# Patient Record
Sex: Female | Born: 1970 | Hispanic: No | Marital: Married | Smoking: Never smoker
Health system: Southern US, Community
[De-identification: ages and names within clinical notes are randomized; demographics above are authoritative.]

## PROBLEM LIST (undated history)

## (undated) ENCOUNTER — Emergency Department (HOSPITAL_BASED_OUTPATIENT_CLINIC_OR_DEPARTMENT_OTHER): Admission: EM | Payer: 59

## (undated) DIAGNOSIS — E119 Type 2 diabetes mellitus without complications: Secondary | ICD-10-CM

## (undated) DIAGNOSIS — D649 Anemia, unspecified: Secondary | ICD-10-CM

## (undated) DIAGNOSIS — E7800 Pure hypercholesterolemia, unspecified: Secondary | ICD-10-CM

## (undated) DIAGNOSIS — E559 Vitamin D deficiency, unspecified: Secondary | ICD-10-CM

## (undated) DIAGNOSIS — E049 Nontoxic goiter, unspecified: Secondary | ICD-10-CM

## (undated) DIAGNOSIS — E041 Nontoxic single thyroid nodule: Secondary | ICD-10-CM

## (undated) DIAGNOSIS — E785 Hyperlipidemia, unspecified: Secondary | ICD-10-CM

## (undated) DIAGNOSIS — Z862 Personal history of diseases of the blood and blood-forming organs and certain disorders involving the immune mechanism: Secondary | ICD-10-CM

## (undated) HISTORY — DX: Hyperlipidemia, unspecified: E78.5

## (undated) HISTORY — PX: DILATION AND CURETTAGE OF UTERUS: SHX78

## (undated) HISTORY — PX: OTHER SURGICAL HISTORY: SHX169

## (undated) HISTORY — DX: Anemia, unspecified: D64.9

## (undated) HISTORY — DX: Type 2 diabetes mellitus without complications: E11.9

---

## 1999-10-29 ENCOUNTER — Ambulatory Visit (HOSPITAL_COMMUNITY): Admission: RE | Admit: 1999-10-29 | Discharge: 1999-10-29 | Payer: Self-pay | Admitting: Obstetrics

## 1999-10-29 ENCOUNTER — Inpatient Hospital Stay (HOSPITAL_COMMUNITY): Admission: RE | Admit: 1999-10-29 | Discharge: 1999-10-29 | Payer: Self-pay | Admitting: *Deleted

## 1999-11-04 ENCOUNTER — Ambulatory Visit (HOSPITAL_COMMUNITY): Admission: AD | Admit: 1999-11-04 | Discharge: 1999-11-04 | Payer: Self-pay | Admitting: *Deleted

## 1999-11-21 ENCOUNTER — Encounter: Admission: RE | Admit: 1999-11-21 | Discharge: 1999-11-21 | Payer: Self-pay | Admitting: Obstetrics

## 2000-06-29 ENCOUNTER — Ambulatory Visit (HOSPITAL_COMMUNITY): Admission: RE | Admit: 2000-06-29 | Discharge: 2000-06-29 | Payer: Self-pay | Admitting: *Deleted

## 2000-08-13 ENCOUNTER — Ambulatory Visit (HOSPITAL_COMMUNITY): Admission: RE | Admit: 2000-08-13 | Discharge: 2000-08-13 | Payer: Self-pay | Admitting: *Deleted

## 2001-01-15 ENCOUNTER — Encounter (HOSPITAL_COMMUNITY): Admission: RE | Admit: 2001-01-15 | Discharge: 2001-01-26 | Payer: Self-pay | Admitting: *Deleted

## 2001-01-16 ENCOUNTER — Inpatient Hospital Stay (HOSPITAL_COMMUNITY): Admission: AD | Admit: 2001-01-16 | Discharge: 2001-01-16 | Payer: Self-pay | Admitting: *Deleted

## 2001-01-22 ENCOUNTER — Observation Stay (HOSPITAL_COMMUNITY): Admission: AD | Admit: 2001-01-22 | Discharge: 2001-01-22 | Payer: Self-pay | Admitting: Obstetrics

## 2001-01-25 ENCOUNTER — Inpatient Hospital Stay (HOSPITAL_COMMUNITY): Admission: AD | Admit: 2001-01-25 | Discharge: 2001-01-28 | Payer: Self-pay | Admitting: Obstetrics

## 2001-02-01 ENCOUNTER — Inpatient Hospital Stay (HOSPITAL_COMMUNITY): Admission: AD | Admit: 2001-02-01 | Discharge: 2001-02-01 | Payer: Self-pay | Admitting: Obstetrics & Gynecology

## 2004-08-10 ENCOUNTER — Emergency Department (HOSPITAL_COMMUNITY): Admission: EM | Admit: 2004-08-10 | Discharge: 2004-08-10 | Payer: Self-pay

## 2005-05-01 ENCOUNTER — Inpatient Hospital Stay (HOSPITAL_COMMUNITY): Admission: AD | Admit: 2005-05-01 | Discharge: 2005-05-01 | Payer: Self-pay | Admitting: Obstetrics

## 2005-08-01 ENCOUNTER — Ambulatory Visit (HOSPITAL_COMMUNITY): Admission: RE | Admit: 2005-08-01 | Discharge: 2005-08-01 | Payer: Self-pay | Admitting: Obstetrics

## 2005-09-17 ENCOUNTER — Inpatient Hospital Stay (HOSPITAL_COMMUNITY): Admission: RE | Admit: 2005-09-17 | Discharge: 2005-09-20 | Payer: Self-pay | Admitting: Obstetrics

## 2005-09-26 ENCOUNTER — Inpatient Hospital Stay (HOSPITAL_COMMUNITY): Admission: AD | Admit: 2005-09-26 | Discharge: 2005-09-26 | Payer: Self-pay | Admitting: Obstetrics

## 2005-10-03 ENCOUNTER — Ambulatory Visit (HOSPITAL_COMMUNITY): Admission: RE | Admit: 2005-10-03 | Discharge: 2005-10-03 | Payer: Self-pay | Admitting: Obstetrics & Gynecology

## 2006-07-03 ENCOUNTER — Encounter: Admission: RE | Admit: 2006-07-03 | Discharge: 2006-07-03 | Payer: Self-pay | Admitting: Otolaryngology

## 2010-09-08 ENCOUNTER — Encounter: Payer: Self-pay | Admitting: Obstetrics & Gynecology

## 2010-10-18 ENCOUNTER — Encounter (INDEPENDENT_AMBULATORY_CARE_PROVIDER_SITE_OTHER): Payer: Self-pay | Admitting: *Deleted

## 2010-10-18 LAB — CONVERTED CEMR LAB
AST: 16 units/L (ref 0–37)
Albumin: 4.3 g/dL (ref 3.5–5.2)
CO2: 27 meq/L (ref 19–32)
Calcium: 9.9 mg/dL (ref 8.4–10.5)
Ferritin: 81 ng/mL (ref 10–291)
Free Thyroxine Index: 2.7 (ref 1.0–3.9)
HCT: 35.9 % — ABNORMAL LOW (ref 36.0–46.0)
Hemoglobin: 11.7 g/dL — ABNORMAL LOW (ref 12.0–15.0)
Hgb A1c MFr Bld: 6 % — ABNORMAL HIGH (ref <5.7)
MCHC: 32.6 g/dL (ref 30.0–36.0)
MCV: 86.1 fL (ref 78.0–100.0)
Platelets: 371 10*3/uL (ref 150–400)
Potassium: 5.1 meq/L (ref 3.5–5.3)
RBC: 4.17 M/uL (ref 3.87–5.11)
Total Bilirubin: 0.2 mg/dL — ABNORMAL LOW (ref 0.3–1.2)
WBC: 8.7 10*3/uL (ref 4.0–10.5)

## 2010-11-07 ENCOUNTER — Other Ambulatory Visit (HOSPITAL_COMMUNITY): Payer: Self-pay | Admitting: Family Medicine

## 2010-11-07 DIAGNOSIS — E049 Nontoxic goiter, unspecified: Secondary | ICD-10-CM

## 2010-11-28 ENCOUNTER — Ambulatory Visit (HOSPITAL_COMMUNITY)
Admission: RE | Admit: 2010-11-28 | Discharge: 2010-11-28 | Disposition: A | Discharge status: Home / Self Care | Payer: Self-pay | Source: Ambulatory Visit | Attending: Family Medicine | Admitting: Family Medicine

## 2010-11-28 DIAGNOSIS — E042 Nontoxic multinodular goiter: Secondary | ICD-10-CM | POA: Insufficient documentation

## 2010-11-28 DIAGNOSIS — E049 Nontoxic goiter, unspecified: Secondary | ICD-10-CM

## 2011-01-06 ENCOUNTER — Other Ambulatory Visit (HOSPITAL_COMMUNITY): Payer: Self-pay | Admitting: Family Medicine

## 2011-01-06 ENCOUNTER — Other Ambulatory Visit: Payer: Self-pay | Admitting: Family Medicine

## 2011-01-06 DIAGNOSIS — Z1231 Encounter for screening mammogram for malignant neoplasm of breast: Secondary | ICD-10-CM

## 2011-01-16 ENCOUNTER — Ambulatory Visit (HOSPITAL_COMMUNITY)
Admission: RE | Admit: 2011-01-16 | Discharge: 2011-01-16 | Disposition: A | Discharge status: Home / Self Care | Payer: Medicaid Other | Source: Ambulatory Visit | Attending: Family Medicine | Admitting: Family Medicine

## 2011-01-16 DIAGNOSIS — Z1231 Encounter for screening mammogram for malignant neoplasm of breast: Secondary | ICD-10-CM | POA: Insufficient documentation

## 2013-05-27 ENCOUNTER — Other Ambulatory Visit (HOSPITAL_COMMUNITY): Payer: Self-pay | Admitting: Nurse Practitioner

## 2013-05-27 DIAGNOSIS — Z1231 Encounter for screening mammogram for malignant neoplasm of breast: Secondary | ICD-10-CM

## 2013-06-09 ENCOUNTER — Ambulatory Visit (HOSPITAL_COMMUNITY)
Admission: RE | Admit: 2013-06-09 | Discharge: 2013-06-09 | Disposition: A | Discharge status: Home / Self Care | Payer: Medicaid Other | Source: Ambulatory Visit | Attending: Nurse Practitioner | Admitting: Nurse Practitioner

## 2013-06-09 DIAGNOSIS — Z1231 Encounter for screening mammogram for malignant neoplasm of breast: Secondary | ICD-10-CM | POA: Insufficient documentation

## 2013-12-07 ENCOUNTER — Other Ambulatory Visit: Payer: Self-pay | Admitting: Family Medicine

## 2013-12-07 DIAGNOSIS — E0590 Thyrotoxicosis, unspecified without thyrotoxic crisis or storm: Secondary | ICD-10-CM

## 2013-12-12 ENCOUNTER — Ambulatory Visit
Admission: RE | Admit: 2013-12-12 | Discharge: 2013-12-12 | Disposition: A | Discharge status: Home / Self Care | Payer: Medicaid Other | Source: Ambulatory Visit | Attending: Family Medicine | Admitting: Family Medicine

## 2013-12-12 ENCOUNTER — Other Ambulatory Visit: Payer: Self-pay | Admitting: Family Medicine

## 2013-12-12 DIAGNOSIS — M79641 Pain in right hand: Secondary | ICD-10-CM

## 2013-12-12 DIAGNOSIS — E0590 Thyrotoxicosis, unspecified without thyrotoxic crisis or storm: Secondary | ICD-10-CM

## 2013-12-13 ENCOUNTER — Other Ambulatory Visit: Payer: Medicaid Other

## 2013-12-23 ENCOUNTER — Ambulatory Visit: Payer: Medicaid Other | Admitting: Internal Medicine

## 2013-12-29 ENCOUNTER — Ambulatory Visit (INDEPENDENT_AMBULATORY_CARE_PROVIDER_SITE_OTHER): Payer: Medicaid Other | Admitting: Internal Medicine

## 2013-12-29 ENCOUNTER — Encounter: Payer: Self-pay | Admitting: Internal Medicine

## 2013-12-29 VITALS — BP 112/78 | HR 99 | Temp 98.4°F | Resp 12 | Ht 61.0 in | Wt 210.0 lb

## 2013-12-29 DIAGNOSIS — R7989 Other specified abnormal findings of blood chemistry: Secondary | ICD-10-CM

## 2013-12-29 DIAGNOSIS — E042 Nontoxic multinodular goiter: Secondary | ICD-10-CM

## 2013-12-29 DIAGNOSIS — R946 Abnormal results of thyroid function studies: Secondary | ICD-10-CM

## 2013-12-29 DIAGNOSIS — E0590 Thyrotoxicosis, unspecified without thyrotoxic crisis or storm: Secondary | ICD-10-CM

## 2013-12-29 LAB — T4, FREE: FREE T4: 0.79 ng/dL (ref 0.60–1.60)

## 2013-12-29 LAB — TSH: TSH: 0.19 u[IU]/mL — ABNORMAL LOW (ref 0.35–4.50)

## 2013-12-29 LAB — T3, FREE: T3 FREE: 3 pg/mL (ref 2.3–4.2)

## 2013-12-29 NOTE — Patient Instructions (Addendum)
Please return in 6 months. Please stop at the lab. Please join MYchart >> I will send you labs through there. We may need an Uptake and Scan as we discussed, I will let you know.

## 2013-12-29 NOTE — Progress Notes (Addendum)
Patient ID: Sarah GranaSarah A Knueppel, female   DOB: [birth year], 43 y.o.   MRN: [id number]   HPI  Sarah Travis is a 43 y.o.-year-old female originally from [place], referred by her PCP, Dr. Daphine DeutscherMartin, in consultation for thyrotoxycosis.   She was dx with MNG in 2007 and saw Dr. Leslie DalesAltheimer for 3 years (2007-2010) and had yearly U/Ss and TFTs every 6 months >> all stable.   I reviewed pt's thyroid tests: 10/28/2013: TSH 0.173 - per records from PCP Lab Results  Component Value Date   TSH 0.447 10/18/2010    She also had a thyroid U/S on 12/12/2013, showing MNG.  Pt denies feeling nodules in neck, hoarseness, dysphagia/odynophagia, SOB with lying down; but has occasional cough since 2007.  She:  - no weight loss; stable weight - no excessive sweating/heat intolerance; hands and feet are cold - no tremors - no anxiety - + palpitations - occasionally - no problems with concentration - no fatigue - no hyperdefecation  Pt does have a FH of thyroid ds in aunt. No FH of thyroid cancer. No h/o radiation tx to head or neck.  No seaweed or kelp, no recent contrast studies. No steroid use. No herbal supplements.   I reviewed her chart and she also has a history of HL.  ROS: Constitutional: see HPI, + nocturia Eyes: no blurry vision, no xerophthalmia ENT: no sore throat, no nodules palpated in throat, no dysphagia/odynophagia, no hoarseness, + tinnitus Cardiovascular: no CP/SOB/palpitations/leg swelling Respiratory: + cough/no SOB Gastrointestinal: no N/V/D/C Musculoskeletal: no muscle/joint aches Skin: no rashes, + hair loss Neurological: no tremors/numbness/tingling/dizziness Psychiatric: no depression/anxiety  PMH: - see HPI  History   Social History  . Marital Status: Married    Spouse Name: N/A    Number of Children: 3: 1516,12,8 y/o        Occupational History  . Stay at home mom   Social History Main Topics  . Smoking status: Never Smoker   . Smokeless tobacco: No  .  Alcohol Use: No  . Drug Use: No   Current Outpatient Rx  Name  Route  Sig  Dispense  Refill  . Norethindrone-Ethinyl Estradiol Triphasic (TRI-NORINYL, 28,) 0.5/1/0.5-35 MG-MCG tablet   Oral   Take 1 tablet by mouth daily.         . pravastatin (PRAVACHOL) 20 MG tablet   Oral   Take 20 mg by mouth daily.          FH: - diabetes in mother and sister - Hypertension in mother and sister - Hyperlipidemia in father - Heart problems in mother and father - No cancer family history  NKDA  PE: BP 112/78  Pulse 99  Temp(Src) 98.4 F (36.9 C) (Oral)  Resp 12  Ht 5\' 1"  (1.549 m)  Wt 210 lb (95.255 kg)  BMI 39.70 kg/m2  SpO2 97% Wt Readings from Last 3 Encounters:  12/29/13 210 lb (95.255 kg)   Constitutional: overweight, in NAD Eyes: PERRLA, EOMI, no exophthalmos, no lid lag, no stare ENT: moist mucous membranes, + thyromegaly, L>R, no individual nodules palpated; no thyroid bruits, no cervical lymphadenopathy Cardiovascular: RRR, No MRG Respiratory: CTA B Gastrointestinal: abdomen soft, NT, ND, BS+ Musculoskeletal: no deformities, strength intact in all 4 Skin: moist, warm, no rashes Neurological: no tremor with outstretched hands, DTR normal in all 4  ASSESSMENT: 1. Thyrotoxycosis  2. MNG - repeated U/S's in the past, last 12/12/2013:  Right thyroid lobe : 7.7 x 2.9 x 2.8 cm. Thyroid gland is  enlarged and heterogeneous. There is a heterogeneous nodule in the mid right thyroid lobe that measures 2.0 x 1.7 x 2.2 cm. No definite calcifications and the nodule is not significantly vascular. Nodule previously measured 1.7 x 1.5 x 1.4 cm. Complex nodule in the inferior right thyroid lobe measures 2.8 x 2.4 x 2.5 cm. Nodule previously measured 2.3 x 2.3 x 2.3 cm. This nodule has cystic areas within it. No definite calcifications. Third nodule in the mid right thyroid lobe measures 1.5 x 0.9 x 1.6 cm and measured up to 1.5 cm on 07/03/2006.   Left thyroid lobe: 8.7 x 3.7 x 4.2  cm. Left thyroid lobe is enlarged and diffusely heterogeneous. Nodule in the superior left thyroid lobe measures 2.1 x 1.6 x 2.2 cm and heterogeneous. Nodule previously measured 1.9 x 1.8 x 2.5 cm. Nodule in the mid left thyroid lobe measures 3.8 x 1.9 x 3.9 cm. This nodule is diffusely heterogeneous and previously measured 3.8 x 1.9 x 4.1 cm. Nodule in the posterior left thyroid lobe measures 1.6 x 1.3 x 1.4 cm and previously measured 1.5 x 1.4 x 1.6 cm. Dominant nodule in the inferior left thyroid lobe measures 3.7 x 2.8 x 3.4 cm and previously measured 3.0 x 2.9 x 2.9 cm   Isthmus : 0.4 cm. Small nodule along the left side of the isthmus measures up to 0.6 cm and previously measured up to 0.9 cm.   Lymphadenopathy None visualized.   PLAN:  1. And 2. Patient with a recently found low TSH, without thyrotoxic sxs: no weight loss, heat intolerance, hyperdefecation, anxiety. Has occasional palpitations, now new. - she does not appear to have exogenous causes for the low TSH.  - We discussed that possible causes of thyrotoxicosis are:  Graves ds   Thyroiditis toxic multinodular goiter/ toxic adenoma (she has multiple nodules). - I suggested that we check the TSH, fT3 and fT4 and also had thyroid stimulating antibodies to screen for Graves' disease.  - If the tests remain abnormal (which I suspect) I will obtain an uptake and scan to differentiate between the 3 above possible etiologies, but more to see if the nodules are "hot"  - in this case, very low risk for cancer. - I reviewed the thyroid ultrasound images along with the patient, and I feel that the thyroid nodules are not very well defined, and they mainly constitute increased heterogeneity within the gland. I would not recommend biopsy at this point. We may need to repeat thyroid ultrasound in the future, in hopes that some of the nodule we'll organize a little bit more, but probably in 2 years from now.  - I advised her to join my chart to  communicate easier - RTC in 6 months, but possibly sooner for repeat labs  Office Visit on 12/29/2013  Component Date Value Ref Range Status  . TSH 12/29/2013 0.19* 0.35 - 4.50 uIU/mL Final  . Free T4 12/29/2013 0.79  0.60 - 1.60 ng/dL Final  . TSI 96/11/5407 76  <140 % baseline Final  . T3, Free 12/29/2013 3.0  2.3 - 4.2 pg/mL Final   I ordered the Uptake and scan. I will addend the results when they become available.  CLINICAL DATA: Low TSH = 0.19, multinodular goiter  EXAM: THYROID SCAN AND UPTAKE - 24 HOURS  TECHNIQUE: Following the per oral administration of I-131 sodium iodide, the patient returned at 24 hours and uptake measurements were acquired with the uptake probe centered on the neck. Thyroid imaging  was performed following the intravenous administration of the Tc-7065m Pertechnetate.  RADIOPHARMACEUTICALS: 15 microCuries I-131 Sodium Iodide orally and 10.9 mCi Tc-6765m Pertechnetate IV  COMPARISON: Thyroid ultrasound 12/12/2013, 11/28/2010  FINDINGS: 24 hour radioiodine uptake calculated at 12%, normal.  Images of the thyroid gland demonstrate a multinodular appearance of both lobes with multiple warm and cold nodules.  No dominant hot nodules.  Dominant cold nodule at mid to upper pole of LEFT lobe corresponding to the largest nodule on ultrasound, though this nodule is stable in size from an earlier ultrasound from 2012.  IMPRESSION: Multinodular thyroid gland as above.  Normal 24 hr radio iodine uptake of 12%.   Electronically Signed By: Ulyses SouthwardMark Boles M.D. On: 02/16/2014 13:43  I would suggest to Bx the L largest nodules. None of the nodules are hypervascular, contain calcifications or present worrisome features other than size.  Msg sent: Dear Ms Carolee RotaZeinelabdeen, The uptake and scan shows that the thyroid uptake is normal, meaning that, overall, the thyroid is not functioning in overdrive, which is great! There are some "cold spots" in the thyroid,  meaning regions that are mostly inactive. I believe we need to biopsy the 2 largest of these regions. This is done [facility] from [facility], with local anesthesia. Please let me know what you think.  Sincerely, Carlus Pavlovristina Camika Marsico MD   Adequacy Reason Satisfactory For Evaluation. Diagnosis THYROID, FINE NEEDLE ASPIRATION, LEFT INFERIOR (SPECIMEN 1 OF 2, COLLECTED ON 03/08/14): BENIGN. FINDINGS CONSISTENT WITH NON-NEOPLASTIC GOITER. Zandra AbtsOBERT HILLARD MD Pathologist, Electronic Signature (Case signed 03/09/2014) Specimen Clinical Information Hyperthyroidsim and thyroid nodules, Dominant nodule in the inferior left thyroid lobe measures 3.7 x 2.8 x 3.4cm Source Thyroid, Fine Needle Aspiration, Left Inferior, (Specimen 1 of 2, collected on 03/08/2014)  Adequacy Reason Satisfactory For Evaluation. Diagnosis THYROID, FINE NEEDLE ASPIRATION, LEFT MID (SPECIMEN 2 OF 2, COLLECTED ON 03/08/14): BENIGN. FINDINGS CONSISTENT WITH NON-NEOPLASTIC GOITER. Zandra AbtsOBERT HILLARD MD Pathologist, Electronic Signature (Case signed 03/09/2014) Specimen Clinical Information Hyperthyroidism and thyroid nodules, Nodule in mid left thyroid lobe measures 3.8 x 1.9 x 3.9cm, This nodule is diffusely heterogeneous Source Thyroid, Fine Needle Aspiration, Left Mid, (Specimen 2 of 2, collected on 03/08/2014)

## 2014-01-03 ENCOUNTER — Encounter: Payer: Self-pay | Admitting: Internal Medicine

## 2014-01-03 DIAGNOSIS — E0590 Thyrotoxicosis, unspecified without thyrotoxic crisis or storm: Secondary | ICD-10-CM | POA: Insufficient documentation

## 2014-01-03 LAB — THYROID STIMULATING IMMUNOGLOBULIN: TSI: 76 %{baseline} (ref <140)

## 2014-01-10 ENCOUNTER — Telehealth: Payer: Self-pay | Admitting: Internal Medicine

## 2014-01-17 ENCOUNTER — Telehealth: Payer: Self-pay | Admitting: *Deleted

## 2014-01-17 NOTE — Telephone Encounter (Signed)
Returning call about appt trying to figure out whats going on

## 2014-01-17 NOTE — Telephone Encounter (Signed)
Please read note below. Unsure if you called pt or not? Please advise.

## 2014-01-17 NOTE — Telephone Encounter (Signed)
She needs an Uptake and scan scheduled. I did not call her but I ordered this >2 weeks ago so I think she is calling to see what happened.

## 2014-01-20 ENCOUNTER — Encounter: Payer: Self-pay | Admitting: *Deleted

## 2014-02-07 ENCOUNTER — Ambulatory Visit (HOSPITAL_COMMUNITY): Admission: RE | Admit: 2014-02-07 | Payer: Medicaid Other | Source: Ambulatory Visit

## 2014-02-08 ENCOUNTER — Encounter (HOSPITAL_COMMUNITY): Payer: Medicaid Other

## 2014-02-15 ENCOUNTER — Encounter (HOSPITAL_COMMUNITY)
Admission: RE | Admit: 2014-02-15 | Discharge: 2014-02-15 | Disposition: A | Discharge status: Home / Self Care | Payer: Medicaid Other | Source: Ambulatory Visit | Attending: Internal Medicine | Admitting: Internal Medicine

## 2014-02-15 DIAGNOSIS — E042 Nontoxic multinodular goiter: Secondary | ICD-10-CM | POA: Insufficient documentation

## 2014-02-15 DIAGNOSIS — R946 Abnormal results of thyroid function studies: Secondary | ICD-10-CM | POA: Insufficient documentation

## 2014-02-16 ENCOUNTER — Encounter (HOSPITAL_COMMUNITY)
Admission: RE | Admit: 2014-02-16 | Discharge: 2014-02-16 | Disposition: A | Discharge status: Home / Self Care | Payer: Medicaid Other | Source: Ambulatory Visit | Attending: Internal Medicine | Admitting: Internal Medicine

## 2014-02-16 ENCOUNTER — Encounter (HOSPITAL_COMMUNITY): Payer: Self-pay

## 2014-02-16 DIAGNOSIS — R946 Abnormal results of thyroid function studies: Secondary | ICD-10-CM | POA: Insufficient documentation

## 2014-02-16 DIAGNOSIS — E042 Nontoxic multinodular goiter: Secondary | ICD-10-CM | POA: Insufficient documentation

## 2014-02-16 MED ORDER — SODIUM IODIDE I 131 CAPSULE
15.0000 | Freq: Once | INTRAVENOUS | Status: AC | PRN
  Administered 2014-02-16: 15 via ORAL

## 2014-02-16 MED ORDER — SODIUM PERTECHNETATE TC 99M INJECTION
10.9000 | Freq: Once | INTRAVENOUS | Status: AC | PRN
  Administered 2014-02-16: 10.9 via INTRAVENOUS

## 2014-02-24 ENCOUNTER — Telehealth: Payer: Self-pay | Admitting: Internal Medicine

## 2014-02-24 NOTE — Telephone Encounter (Signed)
Pt is interested in biopsy please set up order for this appt thank you

## 2014-02-27 ENCOUNTER — Other Ambulatory Visit: Payer: Self-pay | Admitting: Internal Medicine

## 2014-02-27 DIAGNOSIS — E042 Nontoxic multinodular goiter: Secondary | ICD-10-CM

## 2014-02-27 NOTE — Telephone Encounter (Signed)
I ordered the 2 Bxs.

## 2014-02-27 NOTE — Addendum Note (Signed)
Addended by: Carlus PavlovGHERGHE, Nateisha Moyd on: 02/27/2014 12:53 PM   Modules accepted: Orders

## 2014-02-27 NOTE — Telephone Encounter (Signed)
Please read note below and advise.  

## 2014-03-08 ENCOUNTER — Other Ambulatory Visit (HOSPITAL_COMMUNITY)
Admission: RE | Admit: 2014-03-08 | Discharge: 2014-03-08 | Disposition: A | Discharge status: Home / Self Care | Payer: Medicaid Other | Source: Ambulatory Visit | Attending: Interventional Radiology | Admitting: Interventional Radiology

## 2014-03-08 ENCOUNTER — Ambulatory Visit
Admission: RE | Admit: 2014-03-08 | Discharge: 2014-03-08 | Disposition: A | Discharge status: Home / Self Care | Payer: Medicaid Other | Source: Ambulatory Visit | Attending: Internal Medicine | Admitting: Internal Medicine

## 2014-03-08 DIAGNOSIS — E042 Nontoxic multinodular goiter: Secondary | ICD-10-CM | POA: Insufficient documentation

## 2014-03-08 DIAGNOSIS — E0590 Thyrotoxicosis, unspecified without thyrotoxic crisis or storm: Secondary | ICD-10-CM | POA: Diagnosis not present

## 2014-05-25 ENCOUNTER — Other Ambulatory Visit (HOSPITAL_COMMUNITY): Payer: Self-pay | Admitting: Family Medicine

## 2014-05-25 DIAGNOSIS — Z1231 Encounter for screening mammogram for malignant neoplasm of breast: Secondary | ICD-10-CM

## 2014-06-13 ENCOUNTER — Ambulatory Visit (HOSPITAL_COMMUNITY)
Admission: RE | Admit: 2014-06-13 | Discharge: 2014-06-13 | Disposition: A | Discharge status: Home / Self Care | Payer: Medicaid Other | Source: Ambulatory Visit | Attending: Family Medicine | Admitting: Family Medicine

## 2014-06-13 ENCOUNTER — Other Ambulatory Visit (HOSPITAL_COMMUNITY): Payer: Self-pay | Admitting: Family Medicine

## 2014-06-13 DIAGNOSIS — Z1231 Encounter for screening mammogram for malignant neoplasm of breast: Secondary | ICD-10-CM

## 2014-06-30 ENCOUNTER — Encounter: Payer: Self-pay | Admitting: Internal Medicine

## 2014-06-30 ENCOUNTER — Ambulatory Visit (INDEPENDENT_AMBULATORY_CARE_PROVIDER_SITE_OTHER): Payer: Medicaid Other | Admitting: Internal Medicine

## 2014-06-30 VITALS — BP 114/78 | HR 79 | Temp 98.2°F | Resp 12 | Wt 200.8 lb

## 2014-06-30 DIAGNOSIS — E0520 Thyrotoxicosis with toxic multinodular goiter without thyrotoxic crisis or storm: Secondary | ICD-10-CM | POA: Insufficient documentation

## 2014-06-30 LAB — TSH: TSH: 0.49 u[IU]/mL (ref 0.35–4.50)

## 2014-06-30 LAB — T3, FREE: T3, Free: 3.3 pg/mL (ref 2.3–4.2)

## 2014-06-30 LAB — T4, FREE: Free T4: 0.91 ng/dL (ref 0.60–1.60)

## 2014-06-30 NOTE — Patient Instructions (Signed)
Please stop at the lab.  Please come back for a follow-up appointment in 6 months.  

## 2014-06-30 NOTE — Progress Notes (Signed)
Patient ID: Sarah Travis, female   DOB: [birth year], 43 y.o.   MRN: [id number]   HPI  Sarah GranaSarah A Joswick is a 43 y.o.-year-old female originally from [place], returning for f/u for toxic MNG. Last visit 6 mo ago.  Reviewed hx: She was dx with MNG in 2007 and saw Dr. Leslie DalesAltheimer for 3 years (2007-2010) and had yearly U/Ss and TFTs every 6 months >> all stable.   I reviewed pt's thyroid tests: Lab Results  Component Value Date   TSH 0.19* 12/29/2013   TSH 0.447 10/18/2010   FREET4 0.79 12/29/2013  10/28/2013: TSH 0.173 - per records from PCP  She also had a thyroid U/S on 12/12/2013, showing MNG.  Thyroid Uptake was not increased, at 12%, but the scan was c/w MNG.  Largest 2 nodules were Bx'ed >> benign.  Pt denies feeling nodules in neck, hoarseness, dysphagia/odynophagia, SOB with lying down; but has occasional cough since 2007.  She:  - + weight loss - 10 lbs since last visit - intentional: exercises - no excessive sweating/heat intolerance; hands and feet are cold - no tremors - no anxiety - + palpitations - not frequently - no problems with concentration - no fatigue - no hyperdefecation  I reviewed her chart and she also has a history of HL.  ROS: Constitutional: see HPI, + nocturia Eyes: no blurry vision, no xerophthalmia ENT: no sore throat, no nodules palpated in throat, no dysphagia/odynophagia, no hoarseness, + tinnitus Cardiovascular: no CP/SOB/palpitations/leg swelling Respiratory: + cough/no SOB Gastrointestinal: no N/V/D/C Musculoskeletal: no muscle/joint aches Skin: no rashes, + hair loss Neurological: no tremors/numbness/tingling/dizziness  PE: BP 114/78 mmHg  Pulse 79  Temp(Src) 98.2 F (36.8 C) (Oral)  Resp 12  Wt 200 lb 12.8 oz (91.082 kg)  SpO2 98%  LMP 06/06/2014 Wt Readings from Last 3 Encounters:  06/30/14 200 lb 12.8 oz (91.082 kg)  12/29/13 210 lb (95.255 kg)   Constitutional: overweight, in NAD Eyes: PERRLA, EOMI, no  exophthalmos, no lid lag, no stare ENT: moist mucous membranes, + thyromegaly, L>R, no individual nodules palpated; no thyroid bruits, no cervical lymphadenopathy Cardiovascular: RRR, No MRG Respiratory: CTA B Gastrointestinal: abdomen soft, NT, ND, BS+ Musculoskeletal: no deformities, strength intact in all 4 Skin: moist, warm, no rashes Neurological: no tremor with outstretched hands, DTR normal in all 4  ASSESSMENT: 1. Thyrotoxycosis  2. MNG Repeated thyroid U/S's in the past, last 12/12/2013:  Right thyroid lobe : 7.7 x 2.9 x 2.8 cm. Thyroid gland is enlarged and heterogeneous. There is a heterogeneous nodule in the mid right thyroid lobe that measures 2.0 x 1.7 x 2.2 cm. No definite calcifications and the nodule is not significantly vascular. Nodule previously measured 1.7 x 1.5 x 1.4 cm. Complex nodule in the inferior right thyroid lobe measures 2.8 x 2.4 x 2.5 cm. Nodule previously measured 2.3 x 2.3 x 2.3 cm. This nodule has cystic areas within it. No definite calcifications. Third nodule in the mid right thyroid lobe measures 1.5 x 0.9 x 1.6 cm and measured up to 1.5 cm on 07/03/2006.   Left thyroid lobe: 8.7 x 3.7 x 4.2 cm. Left thyroid lobe is enlarged and diffusely heterogeneous. Nodule in the superior left thyroid lobe measures 2.1 x 1.6 x 2.2 cm and heterogeneous. Nodule previously measured 1.9 x 1.8 x 2.5 cm. Nodule in the mid left thyroid lobe measures 3.8 x 1.9 x 3.9 cm. This nodule is diffusely heterogeneous and previously measured 3.8 x 1.9 x 4.1 cm. Nodule in the  posterior left thyroid lobe measures 1.6 x 1.3 x 1.4 cm and previously measured 1.5 x 1.4 x 1.6 cm. Dominant nodule in the inferior left thyroid lobe measures 3.7 x 2.8 x 3.4 cm and previously measured 3.0 x 2.9 x 2.9 cm   Isthmus : 0.4 cm. Small nodule along the left side of the isthmus measures up to 0.6 cm and previously measured up to 0.9 cm.   Lymphadenopathy None visualized.  02/16/2014 Uptake and Scan: 24  hour radioiodine uptake calculated at 12%, normal.  Images of the thyroid gland demonstrate a multinodular appearance of both lobes with multiple warm and cold nodules. No dominant hot nodules.  Dominant cold nodule at mid to upper pole of LEFT lobe corresponding to the largest nodule on ultrasound, though this nodule is stable in size from an earlier ultrasound from 2012.  IMPRESSION: Multinodular thyroid gland as above.  FNA 2 nodules = benign: Adequacy Reason Satisfactory For Evaluation. Diagnosis THYROID, FINE NEEDLE ASPIRATION, LEFT INFERIOR (SPECIMEN 1 OF 2, COLLECTED ON 03/08/14): BENIGN. FINDINGS CONSISTENT WITH NON-NEOPLASTIC GOITER. Zandra AbtsOBERT HILLARD MD Pathologist, Electronic Signature (Case signed 03/09/2014) Specimen Clinical Information Hyperthyroidsim and thyroid nodules, Dominant nodule in the inferior left thyroid lobe measures 3.7 x 2.8 x 3.4cm Source Thyroid, Fine Needle Aspiration, Left Inferior, (Specimen 1 of 2, collected on 03/08/2014)  Adequacy Reason Satisfactory For Evaluation. Diagnosis THYROID, FINE NEEDLE ASPIRATION, LEFT MID (SPECIMEN 2 OF 2, COLLECTED ON 03/08/14): BENIGN. FINDINGS CONSISTENT WITH NON-NEOPLASTIC GOITER. Zandra AbtsOBERT HILLARD MD Pathologist, Electronic Signature (Case signed 03/09/2014) Specimen Clinical Information Hyperthyroidism and thyroid nodules, Nodule in mid left thyroid lobe measures 3.8 x 1.9 x 3.9cm, This nodule is diffusely heterogeneous Source Thyroid, Fine Needle Aspiration, Left Mid, (Specimen 2 of 2, collected on 03/08/2014)   PLAN:  1. And 2. Patient with 2x low TSH levels, without thyrotoxic sxs: no heat intolerance, hyperdefecation, anxiety. Has occasional palpitations, now new. She had intentional weight loss of 10 lbs since last visit.  - We reviewed her uptake and scan >> suggestive of mild TMNG.  - the 2 cold thyroid nodules were Bx'ed >> benign - we will need another U/S at 2 years after the previous - will  check the TSH, fT3 and fT4 today  - If the tests worsen, I d/w her that we may need RAITx - will give flu vaccine today - RTC in 6 months, but possibly sooner for repeat labs   Office Visit on 06/30/2014  Component Date Value Ref Range Status  . TSH 06/30/2014 0.49  0.35 - 4.50 uIU/mL Final  . Free T4 06/30/2014 0.91  0.60 - 1.60 ng/dL Final  . T3, Free 16/10/960411/13/2015 3.3  2.3 - 4.2 pg/mL Final   TFTs normal!

## 2014-11-16 NOTE — Telephone Encounter (Signed)
error 

## 2014-12-29 ENCOUNTER — Ambulatory Visit: Payer: Medicaid Other | Admitting: Internal Medicine

## 2015-07-30 IMAGING — CR DG HAND COMPLETE 3+V*L*
3 series · 3 of 3 positions shown · non-contrast
Comparison: None.

CLINICAL DATA: Pain

EXAM:
LEFT HAND - COMPLETE 3+ VIEW

[view not recorded (1 of 3)]
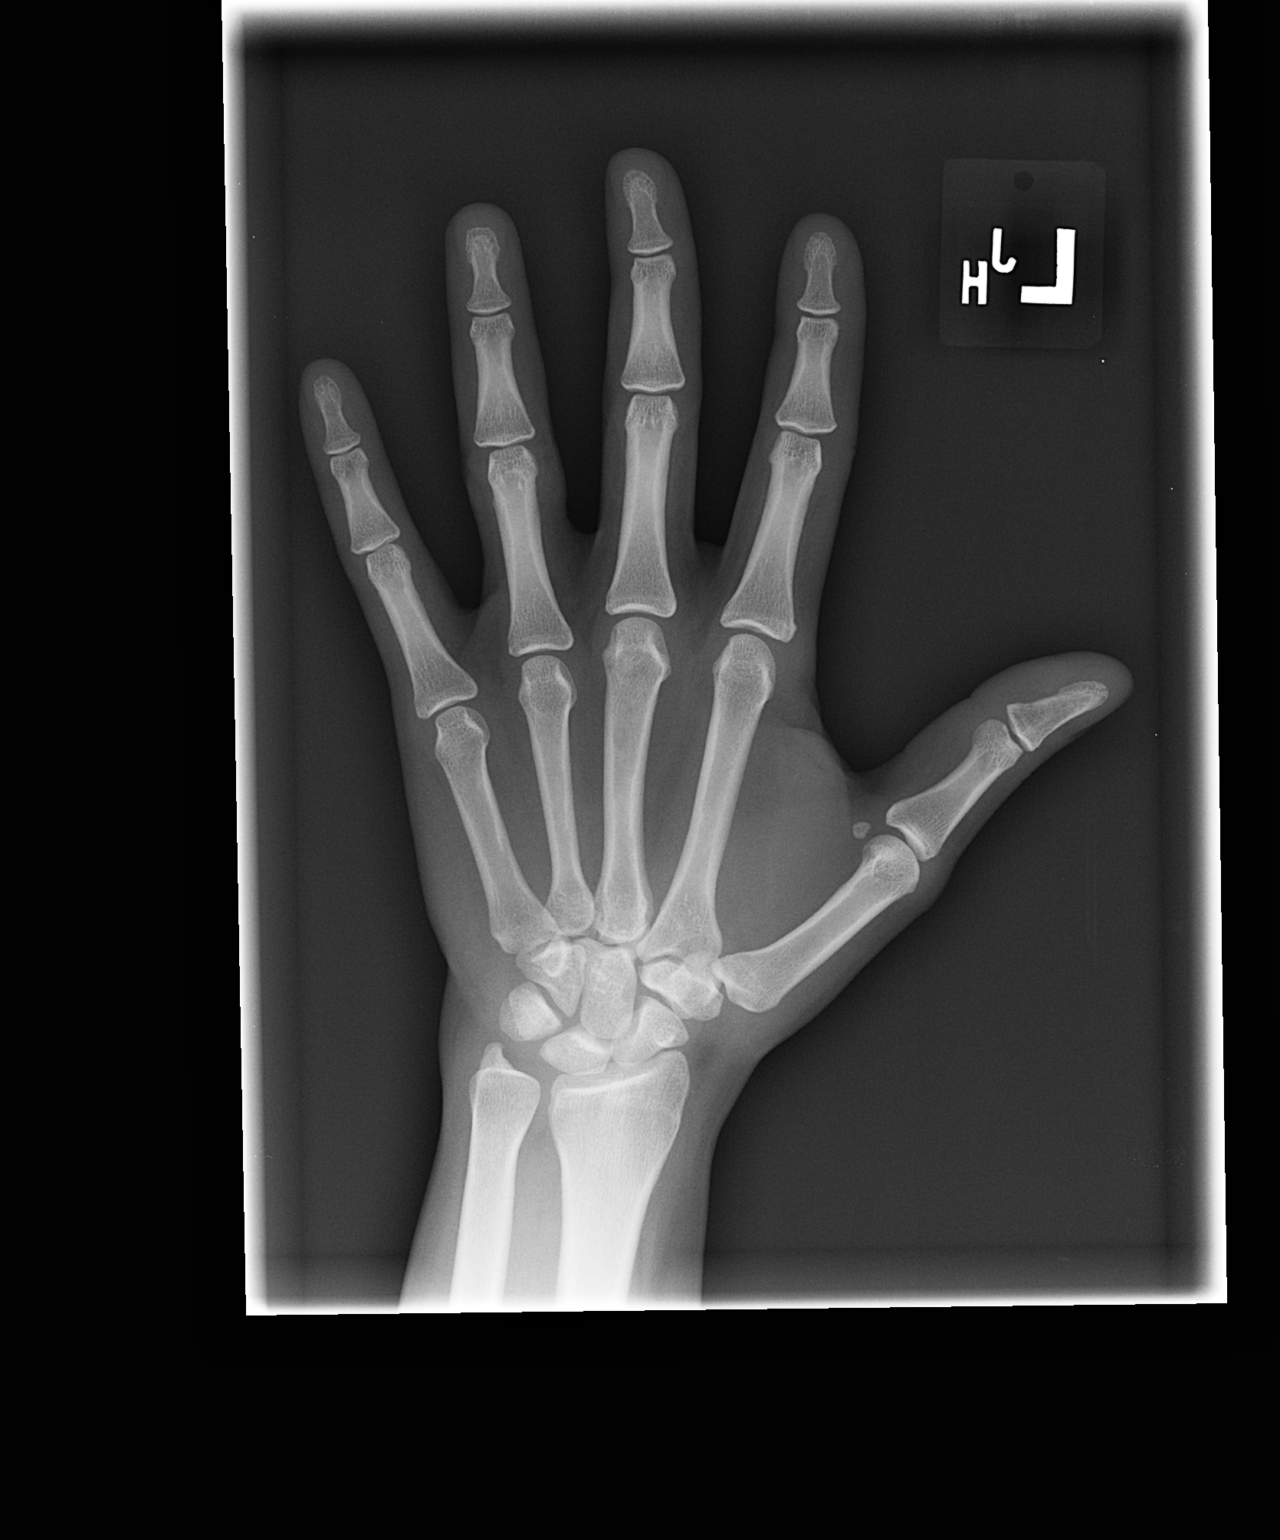

[view not recorded (2 of 3)]
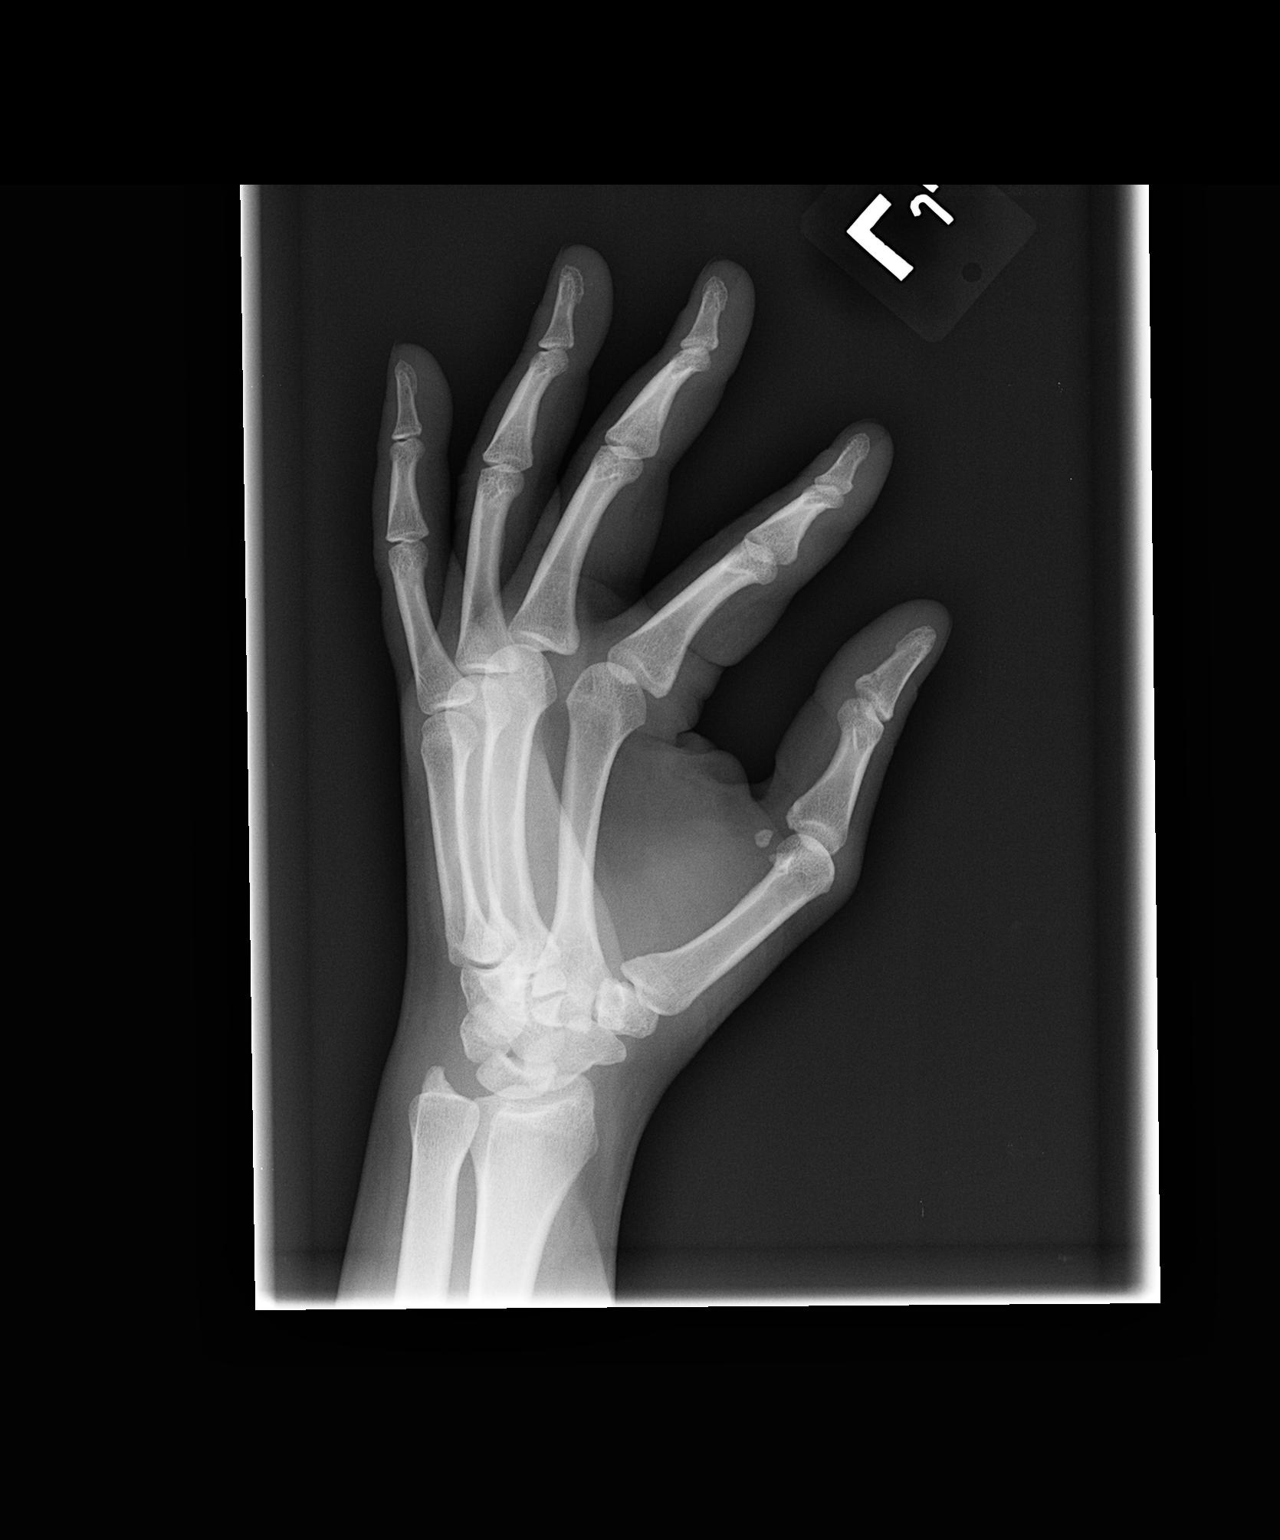

[view not recorded (3 of 3)]
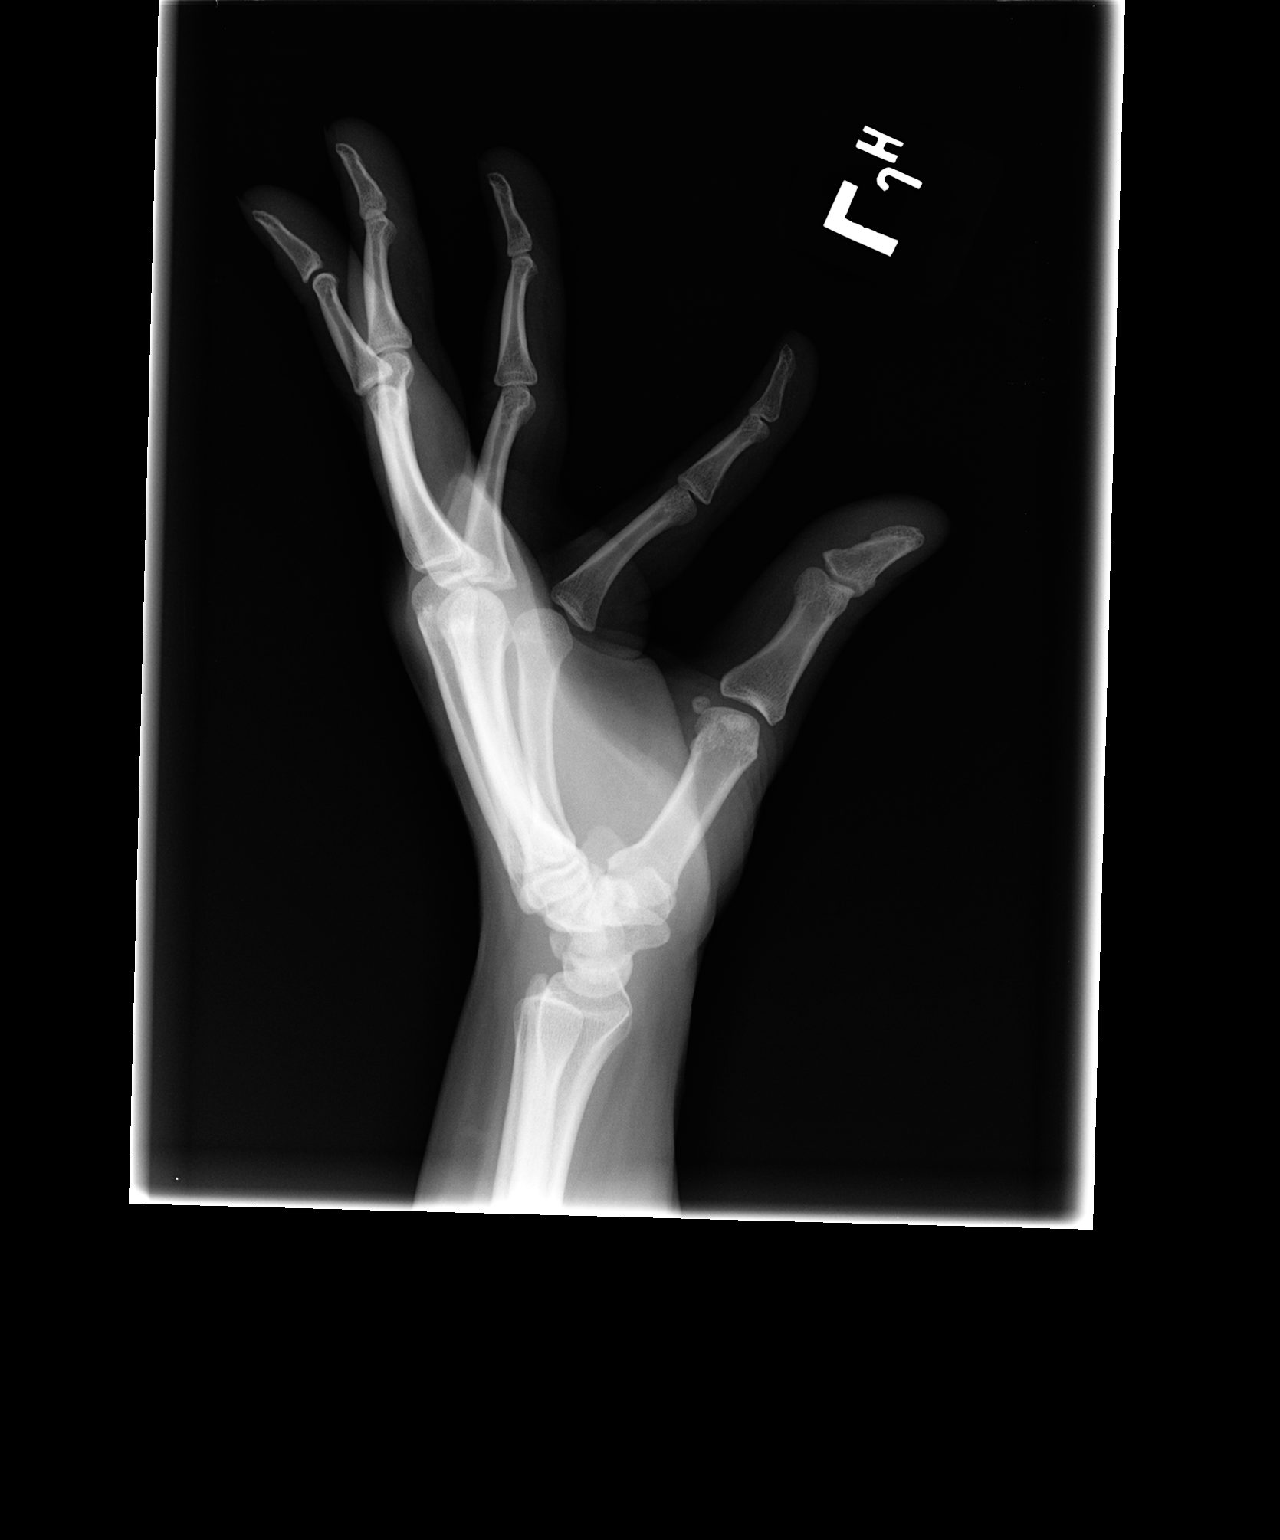

[3 of 3 positions shown; findings below may reference images not displayed]

FINDINGS: Frontal, oblique, and lateral views were obtained. There is no
fracture or dislocation. Joint spaces appear intact. No erosive
change.
IMPRESSION: No abnormality noted.

## 2017-01-29 ENCOUNTER — Encounter: Payer: Self-pay | Admitting: Podiatry

## 2017-01-29 ENCOUNTER — Ambulatory Visit (INDEPENDENT_AMBULATORY_CARE_PROVIDER_SITE_OTHER): Payer: BLUE CROSS/BLUE SHIELD

## 2017-01-29 ENCOUNTER — Ambulatory Visit (INDEPENDENT_AMBULATORY_CARE_PROVIDER_SITE_OTHER): Payer: BLUE CROSS/BLUE SHIELD | Admitting: Podiatry

## 2017-01-29 DIAGNOSIS — M722 Plantar fascial fibromatosis: Secondary | ICD-10-CM

## 2017-01-29 MED ORDER — METHYLPREDNISOLONE 4 MG PO TBPK: ORAL_TABLET | ORAL | 0 refills | Status: DC

## 2017-01-29 NOTE — Patient Instructions (Signed)

## 2017-01-29 NOTE — Progress Notes (Signed)
   Subjective:    Patient ID: Sarah Travis, female    DOB: May 22, 1971, 46 y.o.   MRN: [id number]  HPI  46 year old female presents the also concerns her right foot pain which is been ongoing for about 7 months. She states that she. She saw another podiatrist in which she had 2 shots to her foot which do not help and she was also given an anti-inflammatory medicine but did not help much. She is also given a night splint which she uses intermittently. She states that she gets pain in the right heel mostly in the morning she prescribed. After walking on hard surfaces all day. She denies any change or increase in activity she denies any swelling or redness. She denies any numbness or tingling the pain does not wake her up at night. She has no claudication symptoms. She has no other concerns today. No other treatment. No recent injury or trauma.   Review of Systems  All other systems reviewed and are negative.      Objective:   Physical Exam General: AAO x3, NAD  Dermatological: Skin is warm, dry and supple bilateral. Nails x 10 are well manicured; remaining integument appears unremarkable at this time. There are no open sores, no preulcerative lesions, no rash or signs of infection present.  Vascular: Dorsalis Pedis artery and Posterior Tibial artery pedal pulses are 2/4 bilateral with immedate capillary fill time. There is no pain with calf compression, swelling, warmth, erythema.   Neruologic: Grossly intact via light touch bilateral. Vibratory intact via tuning fork bilateral. Protective threshold with Semmes Wienstein monofilament intact to all pedal sites bilateral. Negative tinel sign.   Musculoskeletal: Tenderness to palpation along the plantar medial tubercle of the calcaneus at the insertion of plantar fascia on the right foot. There is no pain along the course of the plantar fascia within the arch of the foot. Plantar fascia appears to be intact. There is no pain with lateral  compression of the calcaneus or pain with vibratory sensation. There is no pain along the course or insertion of the achilles tendon. No other areas of tenderness to bilateral lower extremities.  Muscular strength 5/5 in all groups tested bilateral.  Gait: Unassisted, Nonantalgic.      Assessment & Plan:  46 year old female right heel pain with plantar fasciitis -Treatment options discussed including all alternatives, risks, and complications -Etiology of symptoms were discussed -X-rays were obtained and reviewed with the patient. No evidence of acute fracture identified. No significant calcaneal spurring is present. -We'll hold off on further steroid injections lasted do not help. -Plantar fascial brace was dispensed. I recommend continue the night splint on a daily basis. She cannot sleep evidence of wear it while watching TV or resting. -Prescribed a Medrol Dosepak. -Stretching and icing daily.  -I discussed shoe gear medications as well as orthotics. Discussed that more custom orthotic. -If symptoms continue discussed with her other treatment options including physical therapy, EPAT, MRI. She agrees.  -RTC 2 weeks or sooner if needed.   Ovid CurdMatthew Wagoner, DPM

## 2017-01-30 DIAGNOSIS — M722 Plantar fascial fibromatosis: Secondary | ICD-10-CM | POA: Insufficient documentation

## 2017-02-12 ENCOUNTER — Ambulatory Visit: Payer: BLUE CROSS/BLUE SHIELD | Admitting: Podiatry

## 2017-03-02 ENCOUNTER — Ambulatory Visit (INDEPENDENT_AMBULATORY_CARE_PROVIDER_SITE_OTHER): Payer: BLUE CROSS/BLUE SHIELD | Admitting: Podiatry

## 2017-03-02 ENCOUNTER — Encounter: Payer: Self-pay | Admitting: Podiatry

## 2017-03-02 DIAGNOSIS — M722 Plantar fascial fibromatosis: Secondary | ICD-10-CM

## 2017-03-06 NOTE — Progress Notes (Signed)
Subjective:  46 year old female presents the office today for follow-up evaluation of right heel pain, plantar fasciitis. She states that she is doing  "much better". She states that the night splint and the brace that helped her quite a bit. She also completed a course of Medrol Dosepak. She is continuing the stretching, icing. She states that however when she takes the plantar fascial brace off she certainly reoccurrence. Denies any systemic complaints such as fevers, chills, nausea, vomiting. No acute changes since last appointment, and no other complaints at this time.   Objective: AAO x3, NAD DP/PT pulses palpable bilaterally, CRT less than 3 seconds There is minimal and decreased tenderness to palpation along the plantar medial tubercle of the calcaneus at the insertion of plantar fascia on the right foot. There is no pain along the course of the plantar fascia within the arch of the foot. Plantar fascia appears to be intact. There is no pain with lateral compression of the calcaneus or pain with vibratory sensation. There is no pain along the course or insertion of the achilles tendon. No other areas of tenderness to bilateral lower extremities. No open lesions or pre-ulcerative lesions.  No pain with calf compression, swelling, warmth, erythema  Assessment: Resolving heel pain, right plantar fasciitis  Plan: -All treatment options discussed with the patient including all alternatives, risks, complications.  -At this point given her job and her symptoms like to think that should benefit from orthotics. She's proceed with a custom molded insert. She [facility] -Continue with night splint, plantar fascial brace -Stretching, icing exercises daily -Discussed shoe gear modifications. -RTC 3 weeks or sooner if needed.  -Patient encouraged to call the office with any questions, concerns, change in symptoms.   Ovid CurdMatthew Bianey Tesoro, DPM

## 2017-03-23 ENCOUNTER — Ambulatory Visit: Payer: BLUE CROSS/BLUE SHIELD | Admitting: Orthotics

## 2017-03-23 DIAGNOSIS — M722 Plantar fascial fibromatosis: Secondary | ICD-10-CM

## 2017-03-23 NOTE — Progress Notes (Signed)
Patient came in today to pick up custom made foot orthotics.  The goals were accomplished and the patient reported no dissatisfaction with said orthotics.  Patient was advised of breakin period and how to report any issues. 

## 2017-09-04 ENCOUNTER — Encounter: Payer: Self-pay | Admitting: Obstetrics and Gynecology

## 2017-09-15 ENCOUNTER — Encounter: Payer: Self-pay | Admitting: *Deleted

## 2017-10-05 ENCOUNTER — Encounter: Payer: Self-pay | Admitting: Obstetrics and Gynecology

## 2020-03-29 ENCOUNTER — Other Ambulatory Visit: Payer: Self-pay | Admitting: Family Medicine

## 2020-03-29 DIAGNOSIS — Z1231 Encounter for screening mammogram for malignant neoplasm of breast: Secondary | ICD-10-CM

## 2020-04-04 ENCOUNTER — Ambulatory Visit
Admission: RE | Admit: 2020-04-04 | Discharge: 2020-04-04 | Disposition: A | Discharge status: Home / Self Care | Payer: Medicaid Other | Source: Ambulatory Visit | Attending: Family Medicine | Admitting: Family Medicine

## 2020-04-04 ENCOUNTER — Other Ambulatory Visit: Payer: Self-pay

## 2020-04-04 DIAGNOSIS — Z1231 Encounter for screening mammogram for malignant neoplasm of breast: Secondary | ICD-10-CM

## 2020-04-06 ENCOUNTER — Other Ambulatory Visit: Payer: Self-pay | Admitting: Family Medicine

## 2020-04-06 DIAGNOSIS — R928 Other abnormal and inconclusive findings on diagnostic imaging of breast: Secondary | ICD-10-CM

## 2020-04-20 ENCOUNTER — Other Ambulatory Visit: Payer: Self-pay

## 2020-04-20 ENCOUNTER — Ambulatory Visit
Admission: RE | Admit: 2020-04-20 | Discharge: 2020-04-20 | Disposition: A | Discharge status: Home / Self Care | Payer: Medicaid Other | Source: Ambulatory Visit | Attending: Family Medicine | Admitting: Family Medicine

## 2020-04-20 ENCOUNTER — Ambulatory Visit
Admission: RE | Admit: 2020-04-20 | Discharge: 2020-04-20 | Disposition: A | Discharge status: Home / Self Care | Payer: Commercial Managed Care - PPO | Source: Ambulatory Visit | Attending: Family Medicine | Admitting: Family Medicine

## 2020-04-20 DIAGNOSIS — R928 Other abnormal and inconclusive findings on diagnostic imaging of breast: Secondary | ICD-10-CM

## 2020-08-06 ENCOUNTER — Encounter: Payer: Self-pay | Admitting: Endocrinology

## 2021-06-19 ENCOUNTER — Other Ambulatory Visit: Payer: Self-pay | Admitting: Family Medicine

## 2021-06-19 DIAGNOSIS — Z1231 Encounter for screening mammogram for malignant neoplasm of breast: Secondary | ICD-10-CM

## 2021-07-04 LAB — HM PAP SMEAR

## 2021-07-15 ENCOUNTER — Telehealth: Payer: Self-pay

## 2021-07-15 NOTE — Telephone Encounter (Signed)
Sarah Travis called to follow up her referral back to [facility]. Patient has been advised she will be call as soon as an appointment becomes available. Call back phone [phone].

## 2021-07-16 NOTE — Telephone Encounter (Signed)
Vm left for patient to callback. Patient will need to have a new referral placed as she has not been seen in over 3 years per Dr. Lonzo Cloud.

## 2021-07-16 NOTE — Telephone Encounter (Signed)
Sarah Travis called back to see if Dr. Lonzo Cloud will take her on as a patient.  Call back phone [phone].

## 2021-07-17 ENCOUNTER — Other Ambulatory Visit (HOSPITAL_COMMUNITY): Payer: Self-pay

## 2021-07-23 ENCOUNTER — Ambulatory Visit
Admission: RE | Admit: 2021-07-23 | Discharge: 2021-07-23 | Disposition: A | Discharge status: Home / Self Care | Payer: 59 | Source: Ambulatory Visit | Attending: Family Medicine | Admitting: Family Medicine

## 2021-07-23 DIAGNOSIS — Z1231 Encounter for screening mammogram for malignant neoplasm of breast: Secondary | ICD-10-CM

## 2021-10-25 ENCOUNTER — Other Ambulatory Visit: Payer: Self-pay

## 2021-10-25 ENCOUNTER — Ambulatory Visit
Admission: EM | Admit: 2021-10-25 | Discharge: 2021-10-25 | Disposition: A | Discharge status: Home / Self Care | Payer: 59

## 2021-10-25 ENCOUNTER — Other Ambulatory Visit (HOSPITAL_COMMUNITY): Payer: Self-pay

## 2021-10-25 DIAGNOSIS — J302 Other seasonal allergic rhinitis: Secondary | ICD-10-CM | POA: Diagnosis not present

## 2021-10-25 HISTORY — DX: Nontoxic goiter, unspecified: E04.9

## 2021-10-25 HISTORY — DX: Vitamin D deficiency, unspecified: E55.9

## 2021-10-25 HISTORY — DX: Pure hypercholesterolemia, unspecified: E78.00

## 2021-10-25 MED ORDER — FLUTICASONE PROPIONATE 50 MCG/ACT NA SUSP
2.0000 | Freq: Every day | NASAL | 5 refills | Status: DC
  Filled 2021-10-25: qty 16, 30d supply, fill #0

## 2021-10-25 MED ORDER — FLUTICASONE PROPIONATE 50 MCG/ACT NA SUSP: 2.0000 | Freq: Every day | NASAL | 5 refills | Status: DC

## 2021-10-25 MED ORDER — CETIRIZINE HCL 10 MG PO TABS: 10.0000 mg | ORAL_TABLET | Freq: Every day | ORAL | 5 refills | Status: DC

## 2021-10-25 NOTE — ED Triage Notes (Signed)
Pt presents with cough and sore throat intermittent x 10 days.  Denies HA, SOB, fever.  Cough productive for clear mucous.  Tried Robitussin, cold and flu meds, and cepacol with temporary relief. Cough is keeping her up at night.  ?

## 2021-10-25 NOTE — ED Provider Notes (Addendum)
[facility]    CSN: [id number] Arrival date & time: 10/25/21  1308    HISTORY   Chief Complaint  Patient presents with   Cough   HPI Sarah Travis is a 51 y.o. female. Pt presents with cough and sore throat intermittent x 10 days.  Denies HA, SOB, fever.  Cough productive for clear mucous.  Tried Robitussin, cold and flu meds, and cepacol with temporary relief. Cough is keeping her up at night.  Denies fever, aches, chills, nausea, vomiting, diarrhea, shortness of breath, loss of taste or smell, headache.  The history is provided by the patient.  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    Home Medications    Prior to Admission medications   Medication Sig Start Date End Date Taking? Authorizing Provider  Multiple Vitamins-Minerals (VITAMIN D3 COMPLETE PO) Take by mouth.   Yes [provider]  Phenazopyridine HCl (AZO TABS PO) Take by mouth.   Yes [provider]  rosuvastatin (CRESTOR) 10 MG tablet Take 10 mg by mouth daily.   Yes [provider]  loratadine (CLARITIN) 10 MG tablet  06/22/14   [provider]  methylPREDNISolone (MEDROL DOSEPAK) 4 MG TBPK tablet Take as directed 01/29/17   Vivi Barrack, DPM  Multiple Vitamin (MULTIVITAMIN) tablet Take 1 tablet by mouth daily.    [provider]  Norethindrone-Ethinyl Estradiol Triphasic (TRI-NORINYL) 0.5/1/0.5-35 MG-MCG tablet Take 1 tablet by mouth daily.    [provider]  pravastatin (PRAVACHOL) 20 MG tablet Take 20 mg by mouth daily.    [provider]  TRI-PREVIFEM  0.18/0.215/0.25 MG-35 MCG tablet Take 1 tablet by mouth daily. 01/13/17   [provider]   Family History No family history on file. Social History Social History   Tobacco Use   Smoking status: Never   Smokeless tobacco: Never  Vaping Use   Vaping Use: Never used  Substance Use Topics   Alcohol use: Never   Drug use: Never   Allergies   Patient has no known allergies.  Review of Systems Review of Systems Pertinent findings noted in history of present illness.   Physical Exam Triage Vital Signs ED Triage Vitals  Enc Vitals Group     BP 06/14/21 0827 (!) 147/82     Pulse Rate 06/14/21 0827 72     Resp 06/14/21 0827 18     Temp 06/14/21 0827 98.3 F (36.8 C)     Temp Source 06/14/21 0827 Oral     SpO2 06/14/21 0827 98 %     Weight --      Height --      Head Circumference --      Peak Flow --      Pain Score 06/14/21 0826 5     Pain Loc --      Pain Edu? --      Excl. in GC? --   No data found.  Updated Vital Signs BP 127/85 (BP Location: Left Arm)    Pulse 82    Temp 98.9 F (  37.2 C) (Oral)    Resp 20    SpO2 95%   Physical Exam Vitals and nursing note reviewed.  Constitutional:      General: She is not in acute distress.    Appearance: Normal appearance. She is not ill-appearing.  HENT:     Head: Normocephalic and atraumatic.     Salivary Glands: Right salivary gland is not diffusely enlarged or tender. Left salivary gland is not diffusely enlarged or tender.     Right Ear: Ear canal and external ear normal. No drainage. A middle ear effusion is present. There is no impacted cerumen. Tympanic membrane is bulging. Tympanic membrane is not injected or erythematous.     Left Ear: Ear canal and external ear normal. No drainage. A middle ear effusion is present. There is no impacted cerumen. Tympanic membrane is bulging. Tympanic membrane is not injected or erythematous.     Ears:     Comments: Bilateral EACs normal, both TMs bulging with clear fluid     Nose: Rhinorrhea present. No nasal deformity, septal deviation, signs of injury, nasal tenderness, mucosal edema or congestion. Rhinorrhea is clear.     Right Nostril: Occlusion present. No foreign body, epistaxis or septal hematoma.     Left Nostril: Occlusion present. No foreign body, epistaxis or septal hematoma.     Right Turbinates: Enlarged, swollen and pale.     Left Turbinates: Enlarged, swollen and pale.     Right Sinus: No maxillary sinus tenderness or frontal sinus tenderness.     Left Sinus: No maxillary sinus tenderness or frontal sinus tenderness.     Mouth/Throat:     Lips: Pink. No lesions.     Mouth: Mucous membranes are moist. No oral lesions.     Pharynx: Oropharynx is clear. Uvula midline. No posterior oropharyngeal erythema or uvula swelling.     Tonsils: No tonsillar exudate. 0 on the right. 0 on the left.     Comments: Postnasal drip Eyes:     General: Lids are normal.        Right eye: No discharge.        Left eye: No discharge.     Extraocular Movements: Extraocular movements intact.     Conjunctiva/sclera: Conjunctivae normal.     Right eye: Right conjunctiva is not injected.     Left eye: Left conjunctiva is not injected.  Neck:     Trachea: Trachea and phonation normal.  Cardiovascular:     Rate and Rhythm: Normal rate and regular rhythm.     Pulses: Normal pulses.     Heart sounds: Normal heart sounds. No murmur heard.   No friction rub. No gallop.  Pulmonary:     Effort: Pulmonary effort is normal. No accessory muscle usage, prolonged expiration or respiratory distress.     Breath sounds: Normal breath sounds. No stridor, decreased air movement or transmitted upper airway sounds. No decreased breath sounds, wheezing, rhonchi or rales.  Chest:     Chest wall: No tenderness.  Musculoskeletal:        General: Normal range of motion.     Cervical back: Normal range of motion and neck supple. Normal range of motion.  Lymphadenopathy:     Cervical: No  cervical adenopathy.  Skin:    General: Skin is warm and dry.     Findings: No erythema or rash.  Neurological:     General: No focal deficit present.     Mental Status: She is alert and oriented to  person, place, and time.  Psychiatric:        Mood and Affect: Mood normal.        Behavior: Behavior normal.    Visual Acuity Right Eye Distance:   Left Eye Distance:   Bilateral Distance:    Right Eye Near:   Left Eye Near:    Bilateral Near:     UC Couse / Diagnostics / Procedures:    EKG  Radiology No results found.  Procedures Procedures (including critical care time)  UC Diagnoses / Final Clinical Impressions(s)   I have reviewed the triage vital signs and the nursing notes.  Pertinent labs & imaging results that were available during my care of the patient were reviewed by me and considered in my medical decision making (see chart for details).   Final diagnoses:  Seasonal allergic rhinitis, unspecified trigger   Physical exam entirely unremarkable for any signs of infection.  Patient provided with a prescription for Zyrtec and Flonase, have advised patient to continue this medication at least through June.  Patient advised to follow-up with primary care if she is not feeling better in 10 days.  ED Prescriptions     Medication Sig Dispense Auth. Provider   cetirizine (ZYRTEC ALLERGY) 10 MG tablet Take 1 tablet (10 mg total) by mouth at bedtime. 30 tablet Theadora RamaMorgan, Izaih Kataoka Scales, PA-C   fluticasone (FLONASE) 50 MCG/ACT nasal spray Place 2 sprays into both nostrils daily. 18 mL Theadora RamaMorgan, Kalden Wanke Scales, PA-C      PDMP not reviewed this encounter.  Pending results:  Labs Reviewed - No data to display  Medications Ordered in UC: Medications - No data to display  Disposition Upon Discharge:  Condition: stable for discharge home Home: take medications as prescribed; routine discharge instructions as discussed; follow up as advised.  Patient presented with an acute  illness with associated systemic symptoms and significant discomfort requiring urgent management. In my opinion, this is a condition that a prudent lay person (someone who possesses an average knowledge of health and medicine) may potentially expect to result in complications if not addressed urgently such as respiratory distress, impairment of bodily function or dysfunction of bodily organs.   Routine symptom specific, illness specific and/or disease specific instructions were discussed with the patient and/or caregiver at length.   As such, the patient has been evaluated and assessed, work-up was performed and treatment was provided in alignment with urgent care protocols and evidence based medicine.  Patient/parent/caregiver has been advised that the patient may require follow up for further testing and treatment if the symptoms continue in spite of treatment, as clinically indicated and appropriate.  If the patient was tested for COVID-19, Influenza and/or RSV, then the patient/parent/guardian was advised to isolate at home pending the results of his/her diagnostic coronavirus test and potentially longer if theyre positive. I have also advised pt that if his/her COVID-19 test returns positive, it's recommended to self-isolate for at least 10 days after symptoms first appeared AND until fever-free for 24 hours without fever reducer AND other symptoms have improved or resolved. Discussed self-isolation recommendations as well as instructions for household member/close contacts as per the [facility] and [place] [facility], and also gave patient the COVID packet with this information.  Patient/parent/caregiver has been [facility] 3-5 days if no better; to PCP or the Emergency Department if new signs and symptoms develop, or if the current signs or symptoms continue to change or worsen for further workup, evaluation  and treatment as clinically indicated and appropriate  The patient will follow up  with their current PCP if and as advised. If the patient does not currently have a PCP we will assist them in obtaining one.   The patient may need specialty follow up if the symptoms continue, in spite of conservative treatment and management, for further workup, evaluation, consultation and treatment as clinically indicated and appropriate.  Patient/parent/caregiver verbalized understanding and agreement of plan as discussed.  All questions were addressed during visit.  Please see discharge instructions below for further details of plan.  Discharge Instructions:   Discharge Instructions      Your symptoms and my physical exam findings are concerning for exacerbation of your underlying allergies.  It is important that you are consistent with taking allergy medications exactly as prescribed.     Zyrtec (cetirizine): This is an excellent second-generation antihistamine that helps to reduce respiratory inflammatory response to environmental allergens.  In some patients, this medication can cause daytime sleepiness so I recommend that you take 1 tablet daily at bedtime.     Flonase (fluticasone): This is a steroid nasal spray that you use once daily, 1 spray in each nare.  This medication does not work well if you decide to use it only used as you feel you need to, it works best used on a daily basis.  After 3 to 5 days of use, you will notice significant reduction of the inflammation and mucus production that is currently being caused by exposure to allergens, whether seasonal or environmental.  The most common side effect of this medication is nosebleeds.  If you experience a nosebleed, please discontinue use for 1 week, then feel free to resume.  I have provided you with a prescription but you can also purchase this medication over-the-counter if your insurance will not cover it.   Not taking your allergy medications on a regular basis increases your risk of more frequent upper respiratory  infections that may or may not require the use of antibiotics, serious exacerbations that require the use of oral steroids, loss of time at work and missed social opportunities.   If you find that you have not had significant relief of your symptoms in the next 7 to 10 days, please follow-up with your primary care provider   Thank you for visiting urgent care today.  We appreciate the opportunity to participate in your care.       This office note has been dictated using Teaching laboratory technician.  Unfortunately, and despite my best efforts, this method of dictation can sometimes lead to occasional typographical or grammatical errors.  I apologize in advance if this occurs.     Theadora Rama Scales, PA-C 10/25/21 1355    Theadora Rama Scales, [place] 10/25/21 1355

## 2021-10-25 NOTE — Discharge Instructions (Signed)
Your symptoms and my physical exam findings are concerning for exacerbation of your underlying allergies.  It is important that you are consistent with taking allergy medications exactly as prescribed.   ?  ?Zyrtec (cetirizine): This is an excellent second-generation antihistamine that helps to reduce respiratory inflammatory response to environmental allergens.  In some patients, this medication can cause daytime sleepiness so I recommend that you take 1 tablet daily at bedtime.   ?  ?Flonase (fluticasone): This is a steroid nasal spray that you use once daily, 1 spray in each nare.  This medication does not work well if you decide to use it only used as you feel you need to, it works best used on a daily basis.  After 3 to 5 days of use, you will notice significant reduction of the inflammation and mucus production that is currently being caused by exposure to allergens, whether seasonal or environmental.  The most common side effect of this medication is nosebleeds.  If you experience a nosebleed, please discontinue use for 1 week, then feel free to resume.  I have provided you with a prescription but you can also purchase this medication over-the-counter if your insurance will not cover it. ?  ?Not taking your allergy medications on a regular basis increases your risk of more frequent upper respiratory infections that may or may not require the use of antibiotics, serious exacerbations that require the use of oral steroids, loss of time at work and missed social opportunities. ?  ?If you find that you have not had significant relief of your symptoms in the next 7 to 10 days, please follow-up with your primary care provider ?  ?Thank you for visiting urgent care today.  We appreciate the opportunity to participate in your care. ? ?

## 2022-01-09 ENCOUNTER — Encounter (HOSPITAL_COMMUNITY): Payer: Self-pay

## 2022-01-09 ENCOUNTER — Emergency Department (HOSPITAL_COMMUNITY)
Admission: EM | Admit: 2022-01-09 | Discharge: 2022-01-09 | Disposition: A | Discharge status: Home / Self Care | Payer: 59 | Attending: Emergency Medicine | Admitting: Emergency Medicine

## 2022-01-09 ENCOUNTER — Other Ambulatory Visit: Payer: Self-pay

## 2022-01-09 DIAGNOSIS — M545 Low back pain, unspecified: Secondary | ICD-10-CM | POA: Diagnosis present

## 2022-01-09 LAB — URINALYSIS, ROUTINE W REFLEX MICROSCOPIC
Bilirubin Urine: NEGATIVE
Glucose, UA: NEGATIVE mg/dL
Hgb urine dipstick: NEGATIVE
Ketones, ur: NEGATIVE mg/dL
Leukocytes,Ua: NEGATIVE
Nitrite: NEGATIVE
Protein, ur: NEGATIVE mg/dL
Specific Gravity, Urine: 1.002 — ABNORMAL LOW (ref 1.005–1.030)
pH: 7 (ref 5.0–8.0)

## 2022-01-09 MED ORDER — CYCLOBENZAPRINE HCL 10 MG PO TABS: 5.0000 mg | ORAL_TABLET | Freq: Two times a day (BID) | ORAL | 0 refills | Status: DC | PRN

## 2022-01-09 MED ORDER — TRAMADOL HCL 50 MG PO TABS: 50.0000 mg | ORAL_TABLET | Freq: Four times a day (QID) | ORAL | 0 refills | Status: DC | PRN

## 2022-01-09 MED ORDER — CELECOXIB 200 MG PO CAPS: 200.0000 mg | ORAL_CAPSULE | Freq: Two times a day (BID) | ORAL | 0 refills | Status: DC

## 2022-01-09 NOTE — Discharge Instructions (Addendum)
SEEK IMMEDIATE MEDICAL ATTENTION IF: New numbness, tingling, weakness, or problem with the use of your arms or legs.  Severe back pain not relieved with medications.  Change in bowel or bladder control.  Increasing pain in any areas of the body (such as chest or abdominal pain).  Shortness of breath, dizziness or fainting.  Nausea (feeling sick to your stomach), vomiting, fever, or sweats.  

## 2022-01-09 NOTE — ED Provider Notes (Signed)
[facility]-EMERGENCY DEPT Provider Note   CSN: [id number] Arrival date & time: 01/09/22  1355     History  Chief Complaint  Patient presents with   Back Pain     Sarah Travis is a 51 y.o. female who complains of low back pain for 3 day(s), constant, worse with  positional changes and with bending or lifting, without radiation down the legs. Precipitating factors:none. 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  There is no numbness in the legs.   Back Pain     Home Medications Prior to Admission medications   Medication Sig Start Date End Date Taking? Authorizing Provider  celecoxib (CELEBREX) 200 MG capsule Take 1 capsule (200 mg total) by mouth 2 (two) times daily. 01/09/22  Yes Arden Axon, PA-C  cyclobenzaprine (FLEXERIL) 10 MG tablet Take 0.5-1 tablets (5-10 mg total) by mouth 2 (two) times daily as needed for muscle spasms. 01/09/22  Yes Brendaliz Kuk, PA-C  traMADol (ULTRAM) 50 MG tablet Take 1 tablet (50 mg total) by mouth every 6 (six) hours as needed for severe pain. 01/09/22  Yes Annahi Short, PA-C  cetirizine (ZYRTEC ALLERGY) 10 MG tablet Take 1 tablet (10 mg total) by mouth at bedtime. 10/25/21 04/23/22  Theadora Rama Scales, PA-C  fluticasone (FLONASE) 50 MCG/ACT nasal spray Place 2 sprays into both nostrils daily. 10/25/21   Theadora Rama Scales, PA-C  fluticasone (FLONASE) 50 MCG/ACT nasal spray Place 2 sprays into both nostrils daily. 10/25/21     Multiple Vitamins-Minerals (VITAMIN D3 COMPLETE PO) Take by mouth.    [provider]  Phenazopyridine HCl (AZO TABS PO) Take by mouth.    [provider]  rosuvastatin (CRESTOR) 10 MG tablet Take 10 mg by mouth daily.    [provider]      Allergies    Patient has no known allergies.    Review of Systems   Review of Systems  Musculoskeletal:  Positive for back pain.   Physical Exam Updated Vital Signs BP (!) 186/94    Pulse 100   Temp 98.1 F (36.7 C) (Oral)   Resp 18   Ht 5\' 2"  (1.575 m)   Wt 99.8 kg   SpO2 100%   BMI 40.24 kg/m  Physical Exam Vitals and nursing note reviewed.  Constitutional:      General: She is not in acute distress.    Appearance: She is well-developed. She is not diaphoretic.  HENT:     Head: Normocephalic and atraumatic.     Right Ear: External ear normal.     Left Ear: External ear normal.     Nose: Nose normal.     Mouth/Throat:     Mouth: Mucous membranes are moist.  Eyes:     General: No scleral icterus.    Conjunctiva/sclera: Conjunctivae normal.  Cardiovascular:     Rate and Rhythm: Normal rate and regular rhythm.     Heart sounds: Normal heart sounds. No murmur heard.   No friction rub. No gallop.  Pulmonary:     Effort: Pulmonary effort is normal. No respiratory distress.     Breath sounds: Normal breath sounds.  Abdominal:     General: Bowel sounds are normal. There is no distension.     Palpations: Abdomen is soft. There is no mass.     Tenderness: There is no abdominal tenderness. There is no guarding.  Musculoskeletal:     Cervical back: Normal range of motion.  Comments: Patient appears to be inmoderate pain, antalgic gait noted. Lumbosacral spine area reveals no local tenderness or mass. Painful and reduced LS ROM noted. Straight leg raise is negative.  Palpable spasm bilateral lumbar paraspinals present.  DTR's, motor strength and sensation normal, including heel and toe gait.  Peripheral pulses are palpable.   Skin:    General: Skin is warm and dry.  Neurological:     Mental Status: She is alert and oriented to person, place, and time.  Psychiatric:        Behavior: Behavior normal.    ED Results / Procedures / Treatments   Labs (all labs ordered are listed, but only abnormal results are displayed) Labs Reviewed  URINALYSIS, ROUTINE W REFLEX MICROSCOPIC - Abnormal; Notable for the following components:      Result Value   Color, Urine  COLORLESS (*)    Specific Gravity, Urine 1.002 (*)    All other components within normal limits    EKG None  Radiology No results found.  Procedures Procedures    Medications Ordered in ED Medications - No data to display  ED Course/ Medical Decision Making/ A&P                           Medical Decision Making Patient with back pain.  This appears to be lumbar sacral strain and spasm.  Patient's urinalysis is negative and she is not having urinary symptoms.  No neurological deficits and normal neuro exam.  Patient can walk but states is painful.  No loss of bowel or bladder control.  No concern for cauda equina.  No fever, night sweats, weight loss, h/o cancer, IVDU.  RICE protocol and pain medicine indicated and discussed with patient.  PDMP reviewed during this visit  Problems Addressed: Lumbosacral pain: acute illness or injury  Amount and/or Complexity of Data Reviewed Labs: ordered.    Details: I reviewed a urinalysis.           Final Clinical Impression(s) / ED Diagnoses Final diagnoses:  Lumbosacral pain    Rx / DC Orders ED Discharge Orders          Ordered    traMADol (ULTRAM) 50 MG tablet  Every 6 hours PRN        01/09/22 1526    cyclobenzaprine (FLEXERIL) 10 MG tablet  2 times daily PRN        01/09/22 1526    celecoxib (CELEBREX) 200 MG capsule  2 times daily        01/09/22 1526              Arthor Captain, PA-C 01/09/22 1527    Lorre Nick, MD 01/09/22 [id number]

## 2022-01-09 NOTE — ED Triage Notes (Signed)
Patient c/o intermittent bilateral back pain x 2 days. Patient states she has used Aleve and Tylenol with very little relief. Patient denies any injury or heavy lifting.

## 2022-02-05 ENCOUNTER — Ambulatory Visit (INDEPENDENT_AMBULATORY_CARE_PROVIDER_SITE_OTHER): Payer: 59 | Admitting: Internal Medicine

## 2022-02-05 ENCOUNTER — Encounter: Payer: Self-pay | Admitting: Internal Medicine

## 2022-02-05 VITALS — BP 124/80 | HR 71 | Ht 62.0 in | Wt 236.0 lb

## 2022-02-05 DIAGNOSIS — E042 Nontoxic multinodular goiter: Secondary | ICD-10-CM | POA: Diagnosis not present

## 2022-02-05 LAB — TSH: TSH: 0.69 u[IU]/mL (ref 0.35–5.50)

## 2022-02-05 LAB — T4, FREE: Free T4: 0.83 ng/dL (ref 0.60–1.60)

## 2022-02-05 NOTE — Progress Notes (Signed)
Name: Sarah Travis  MRN/ DOB: [id number], 1971/01/13    Age/ Sex: 51 y.o., female    PCP: Leilani Able, MD   Reason for Endocrinology Evaluation: MNG     Date of Initial Endocrinology Evaluation: 02/05/2022     HPI: Sarah Travis is a 51 y.o. female with a past medical history of MNG. The patient presented for initial endocrinology clinic visit on 02/05/2022 for consultative assistance with her MNG.   She was diagnosed with multinodular goiter in 2012 based on thyroid ultrasound.  She also had thyroid uptake and scan which was normal at 12% uptake but consistent with multinodular goiter, and a left dominant cold nodule. Left  Cold nodule was biopsied in 2015 with benign cytology She is also S/P benign FNA of the left inferior nodule in 2015   She has noted an increase in size of the neck over the past 3 months.  Denies dysphagia  Denies weight loss  Has occasional palpitations  Denies tremors  Denies diarrhea or constipation She does endorse alternating cold and heat intolerance   LMP 2022   Paternal aunt with thyroid disease    HISTORY:  Past Medical History:  Past Medical History:  Diagnosis Date   High cholesterol    Thyroid goiter    Vitamin D deficiency    Past Surgical History:  Past Surgical History:  Procedure Laterality Date   cesaer     CESAREAN SECTION      Social History:  reports that she has never smoked. She has never used smokeless tobacco. She reports that she does not drink alcohol and does not use drugs. Family History: family history includes Cancer in her mother; Heart failure in her father.   HOME MEDICATIONS: Allergies as of 02/05/2022   No Known Allergies      Medication List        Accurate as of February 05, 2022  7:38 AM. If you have any questions, ask your nurse or doctor.          AZO TABS PO Take by mouth.   celecoxib 200 MG capsule Commonly known as: CeleBREX Take 1 capsule (200 mg total) by mouth  2 (two) times daily.   cetirizine 10 MG tablet Commonly known as: ZyrTEC Allergy Take 1 tablet (10 mg total) by mouth at bedtime.   cyclobenzaprine 10 MG tablet Commonly known as: FLEXERIL Take 0.5-1 tablets (5-10 mg total) by mouth 2 (two) times daily as needed for muscle spasms.   fluticasone 50 MCG/ACT nasal spray Commonly known as: FLONASE Place 2 sprays into both nostrils daily.   fluticasone 50 MCG/ACT nasal spray Commonly known as: FLONASE Place 2 sprays into both nostrils daily.   rosuvastatin 10 MG tablet Commonly known as: CRESTOR Take 10 mg by mouth daily.   traMADol 50 MG tablet Commonly known as: ULTRAM Take 1 tablet (50 mg total) by mouth every 6 (six) hours as needed for severe pain.   VITAMIN D3 COMPLETE PO Take by mouth.          REVIEW OF SYSTEMS: A comprehensive ROS was conducted with the patient and is negative except as per HPI    OBJECTIVE:  VS: BP 124/80 (BP Location: Left Arm, Patient Position: Sitting, Cuff Size: Large)   Pulse 71   Ht 5\' 2"  (1.575 m)   Wt 236 lb (107 kg)   SpO2 94%   BMI 43.16 kg/m    Wt Readings from Last 3 Encounters:  01/09/22 220 lb (99.8 kg)  06/30/14 200 lb 12.8 oz (91.1 kg)  12/29/13 210 lb (95.3 kg)     EXAM: General: Pt appears well and is in NAD  Neck: General: Supple without adenopathy. Thyroid: Left thyroid asymmetry noted  Lungs: Clear with good BS bilat with no rales, rhonchi, or wheezes  Heart: Auscultation: RRR.  Abdomen: Normoactive bowel sounds, soft, nontender, without masses or organomegaly palpable  Extremities:  BL LE: No pretibial edema normal ROM and strength.  Mental Status: Judgment, insight: Intact Orientation: Oriented to time, place, and person Mood and affect: No depression, anxiety, or agitation     DATA REVIEWED:     Latest Reference Range & Units 02/05/22 12:09  TSH 0.35 - 5.50 uIU/mL 0.69  Triiodothyronine (T3) 76 - 181 ng/dL 374  M2,LMBE(MLJQGB) 2.01 - 1.60 ng/dL  0.07   08/06/7587 Uptake and Scan: 24 hour radioiodine uptake calculated at 12%, normal.  Images of the thyroid gland demonstrate a multinodular appearance of both lobes with multiple warm and cold nodules. No dominant hot nodules.  Dominant cold nodule at mid to upper pole of LEFT lobe corresponding to the largest nodule on ultrasound, though this nodule is stable in size from an earlier ultrasound from 2012.  IMPRESSION: Multinodular thyroid gland as above.    FNA left mid nodule 03/08/2014  Diagnosis THYROID, FINE NEEDLE ASPIRATION, LEFT MID (SPECIMEN 2 OF 2, COLLECTED ON 03/08/14): BENIGN. FINDINGS CONSISTENT WITH NON-NEOPLASTIC GOITER.    FNA Left inferior 03/08/2014 THYROID, FINE NEEDLE ASPIRATION, LEFT INFERIOR (SPECIMEN 1 OF 2, COLLECTED ON 03/08/14): BENIGN. FINDINGS CONSISTENT WITH NON-NEOPLASTIC GOITER.  ASSESSMENT/PLAN/RECOMMENDATIONS:   MNG:  -Patient has noted a local increase in neck size over the past few years, but she has no other local neck symptoms such as dysphagia or pain -She is status post benign FNA of the left mid and left inferior nodules in 2015 -She is biochemically euthyroid -We will proceed with thyroid ultrasound  Follow-up in 6 months  Signed electronically by: Lyndle Herrlich, MD  [facility] Endocrinology  [facility] [address] [place] [place], [place] [address] Phone: [phone] FAX: [phone]   CC: Leilani Able, MD [address] [place] [address] Phone: [phone] Fax: [phone]   Return to Endocrinology clinic as below: Future Appointments  Date Time Provider Department Center  02/05/2022 10:50 AM Oz Gammel, Konrad Dolores, MD [id number] None

## 2022-02-06 LAB — T3: T3, Total: 129 ng/dL (ref 76–181)

## 2022-02-08 ENCOUNTER — Ambulatory Visit (HOSPITAL_BASED_OUTPATIENT_CLINIC_OR_DEPARTMENT_OTHER)
Admission: RE | Admit: 2022-02-08 | Discharge: 2022-02-08 | Disposition: A | Discharge status: Home / Self Care | Payer: 59 | Source: Ambulatory Visit | Attending: Internal Medicine | Admitting: Internal Medicine

## 2022-02-08 DIAGNOSIS — E042 Nontoxic multinodular goiter: Secondary | ICD-10-CM | POA: Diagnosis present

## 2022-02-10 ENCOUNTER — Telehealth: Payer: Self-pay | Admitting: Internal Medicine

## 2022-02-10 DIAGNOSIS — E042 Nontoxic multinodular goiter: Secondary | ICD-10-CM

## 2022-02-26 ENCOUNTER — Ambulatory Visit (HOSPITAL_COMMUNITY)
Admission: RE | Admit: 2022-02-26 | Discharge: 2022-02-26 | Disposition: A | Discharge status: Home / Self Care | Payer: 59 | Source: Ambulatory Visit | Attending: Internal Medicine | Admitting: Internal Medicine

## 2022-02-26 DIAGNOSIS — E042 Nontoxic multinodular goiter: Secondary | ICD-10-CM | POA: Diagnosis present

## 2022-02-26 MED ORDER — LIDOCAINE HCL (PF) 1 % IJ SOLN
INTRAMUSCULAR | Status: AC
  Filled 2022-02-26: qty 30

## 2022-02-26 NOTE — Procedures (Signed)
Successful US guided FNA of right mid thyroid nodule Successful US guided FNA of left mid thyroid nodule No complications. See PACS for full report.    Alex Gardener, AGNP-BC 02/26/2022, 2:18 PM

## 2022-02-27 LAB — CYTOLOGY - NON PAP

## 2022-05-29 ENCOUNTER — Other Ambulatory Visit: Payer: Self-pay | Admitting: Family Medicine

## 2022-05-29 DIAGNOSIS — Z1231 Encounter for screening mammogram for malignant neoplasm of breast: Secondary | ICD-10-CM

## 2022-07-18 ENCOUNTER — Ambulatory Visit (INDEPENDENT_AMBULATORY_CARE_PROVIDER_SITE_OTHER): Payer: Commercial Managed Care - HMO | Admitting: Internal Medicine

## 2022-07-18 ENCOUNTER — Encounter: Payer: Self-pay | Admitting: Internal Medicine

## 2022-07-18 VITALS — BP 148/96 | HR 104 | Ht 62.6 in | Wt 239.0 lb

## 2022-07-18 DIAGNOSIS — E042 Nontoxic multinodular goiter: Secondary | ICD-10-CM | POA: Diagnosis not present

## 2022-07-18 NOTE — Progress Notes (Signed)
Name: Sarah Travis  MRN/ DOB: [id number], Dec 15, 1970    Age/ Sex: 51 y.o., female    PCP: Leilani Able, MD   Reason for Endocrinology Evaluation: MNG     Date of Initial Endocrinology Evaluation: 02/05/2022    HPI: [name] is a 51 y.o. female with a past medical history of MNG. The patient presented for initial endocrinology clinic visit on 02/05/2022 for consultative assistance with her MNG.   She was diagnosed with multinodular goiter in 2012 based on thyroid ultrasound.  She also had thyroid uptake and scan which was normal at 12% uptake but consistent with multinodular goiter, and a left dominant cold nodule. Left  Cold nodule was biopsied in 2015 with benign cytology She is also S/P benign FNA of the left inferior nodule in 2015  She is S/P benign FNA of the right mid and left mid nodule in 02/2020    Paternal aunt with thyroid disease     SUBJECTIVE:    Today (07/18/22): Ms . Sarah Travis is here for a follow up on MNG     She has noted an increase in size of the neck over the past 6 months, she does have discomfort on the left  with occasional dysphagia   Has occasional palpitations  Denies tremors  Denies diarrhea or constipation She does endorse alternating cold and heat intolerance     HISTORY:  Past Medical History:  Past Medical History:  Diagnosis Date   High cholesterol    Thyroid goiter    Vitamin D deficiency    Past Surgical History:  Past Surgical History:  Procedure Laterality Date   cesaer     CESAREAN SECTION      Social History:  reports that she has never smoked. She has never used smokeless tobacco. She reports that she does not drink alcohol and does not use drugs. Family History: family history includes Cancer in her mother; Heart failure in her father.   HOME MEDICATIONS: Allergies as of 07/18/2022   No Known Allergies      Medication List        Accurate as of July 18, 2022  2:13 PM.  If you have any questions, ask your nurse or doctor.          cetirizine 10 MG tablet Commonly known as: ZyrTEC Allergy Take 1 tablet (10 mg total) by mouth at bedtime.   Dialyvite Vitamin D 5000 125 MCG (5000 UT) capsule Generic drug: Cholecalciferol Take 5,000 Units by mouth daily.   fluticasone 50 MCG/ACT nasal spray Commonly known as: FLONASE Place 2 sprays into both nostrils daily.   naproxen sodium 220 MG tablet Commonly known as: ALEVE Take 220 mg by mouth daily as needed (pain).   rosuvastatin 40 MG tablet Commonly known as: CRESTOR Take 40 mg by mouth daily.          REVIEW OF SYSTEMS: A comprehensive ROS was conducted with the patient and is negative except as per HPI    OBJECTIVE:  VS: BP (!) 148/96 (BP Location: Left Arm, Patient Position: Sitting, Cuff Size: Normal)   Pulse (!) 104   Ht 5' 2.6" (1.59 m)   Wt 239 lb (108.4 kg)   SpO2 99%   BMI 42.88 kg/m    Wt Readings from Last 3 Encounters:  07/18/22 239 lb (108.4 kg)  02/05/22 236 lb (107 kg)  01/09/22 220 lb (99.8 kg)     EXAM: General: Pt appears well  and is in NAD  Neck: General: Supple without adenopathy. Thyroid: Bilateral thyroid nodules appreciated  Lungs: Clear with good BS bilat with no rales, rhonchi, or wheezes  Heart: Auscultation: RRR.  Abdomen: Normoactive bowel sounds, soft, nontender, without masses or organomegaly palpable  Extremities:  BL LE: No pretibial edema normal ROM and strength.  Mental Status: Judgment, insight: Intact Orientation: Oriented to time, place, and person Mood and affect: No depression, anxiety, or agitation     DATA REVIEWED:     Latest Reference Range & Units 02/05/22 12:09  TSH 0.35 - 5.50 uIU/mL 0.69  Triiodothyronine (T3) 76 - 181 ng/dL 638  V5,IEPP(IRJJOA) 4.16 - 1.60 ng/dL 6.06     Thyroid Ultrasound 02/08/2022  Estimated total number of nodules >/= 1 cm: 6-10   Number of spongiform nodules >/=  2 cm not described below (TR1):  0   Number of mixed cystic and solid nodules >/= 1.5 cm not described below (TR2): 0   _________________________________________________________   Nodule # 1:   Location: RIGHT; Mid   Maximum size: 3.2 cm; Other 2 dimensions: 2.4 x 2.3 cm previously 2.0 x 1.7 x 2.2 cm   Composition: solid/almost completely solid (2)   Echogenicity: isoechoic (1)   Shape: not taller-than-wide (0)   Margins: ill-defined (0)   Echogenic foci: none (0)   ACR TI-RADS total points: 3.   ACR TI-RADS risk category: TR3 (3 points).   ACR TI-RADS recommendations:   **Given size (>/= 2.5 cm) and appearance, fine needle aspiration of this mildly suspicious nodule should be considered based on TI-RADS criteria.   _________________________________________________________   Nodule # 2:   Location: RIGHT; Mid   Maximum size: 1.4 cm; Other 2 dimensions: 1.0 x 1.2 cm previously 1.5 x 0.9 x 1.6 cm   Composition: solid/almost completely solid (2)   Echogenicity: isoechoic (1)   Shape: not taller-than-wide (0)   Margins: ill-defined (0)   Echogenic foci: none (0)   ACR TI-RADS total points: 3.   ACR TI-RADS risk category: TR3 (3 points).   ACR TI-RADS recommendations:   Given size (<1.4 cm) and appearance, this nodule does NOT meet TI-RADS criteria for biopsy or dedicated follow-up.   _________________________________________________________   Nodule # 3:   Location: RIGHT; Inferior   Maximum size: 2.9 cm; Other 2 dimensions: 2.5 x 2.6 cm previously 2.8 x 2.4 x 2.5 cm   Composition: solid/almost completely solid (2)   Echogenicity: isoechoic (1)   Shape: not taller-than-wide (0)   Margins: ill-defined (0)   Echogenic foci: none (0)   ACR TI-RADS total points: 3.   ACR TI-RADS risk category: TR3 (3 points).   ACR TI-RADS recommendations:   **Given size (>/= 2.5 cm) and appearance, fine needle aspiration of this mildly suspicious nodule should be considered based on  TI-RADS criteria.   _________________________________________________________   Nodule # 4:   Location: LEFT; Mid   Maximum size: 2.4 cm; Other 2 dimensions: 2.1 x 2 1 cm previously 3.8 x 1.9 x 3.9 cm   No aggressive features on today's evaluation. This nodule was previously biopsied in 03/08/2014.   Assuming a benign pathologic diagnosis, repeat sampling and/or dedicated follow-up is not recommended.   _________________________________________________________   Nodule # 5:   Location: LEFT; Mid   Maximum size: 4.1 cm; Other 2 dimensions: 2.8 x 3.2 cm previously 3.7 x 2.8 x 3.4 cm   No aggressive features on today's evaluation. This nodule was previously biopsied in 03/08/2014.   Assuming a  benign pathologic diagnosis, repeat sampling and/or dedicated follow-up is not recommended.   _________________________________________________________   Nodule # 6:   Location: LEFT; Mid   Maximum size: 1.4 cm; Other 2 dimensions: 1.2 x 1.2 cm previously 1.6 x 1.3 x 1.4 cm   Composition: solid/almost completely solid (2)   Echogenicity: isoechoic (1)   Shape: not taller-than-wide (0)   Margins: ill-defined (0)   Echogenic foci: macrocalcifications (1)   ACR TI-RADS total points: 4.   ACR TI-RADS risk category: TR4 (4-6 points).   ACR TI-RADS recommendations:   **Given size (>/= 1.0 cm) and appearance, fine needle aspiration of this highly suspicious nodule should be considered based on TI-RADS criteria.   _________________________________________________________   No cervical adenopathy or abnormal fluid collection within the imaged neck.   IMPRESSION: 1. Enlarged multinodular thyroid. 2. 3.2 cm RIGHT mid TR-3 (#1), 2.9 cm RIGHT inferior TR-3 (#3) and 1.4 cm LEFT mid TR-4 (#6) thyroid nodules. FNA biopsy of these highly suspicious nodules should be considered based on TI-RADS criteria. 3. Nodules labeled #4 and #5 on today's evaluation were  previously biopsied in 03/08/2014. Assuming a benign pathologic diagnosis, repeat sampling and/or dedicated follow-up is not recommended.    02/16/2014 Uptake and Scan: 24 hour radioiodine uptake calculated at 12%, normal.  Images of the thyroid gland demonstrate a multinodular appearance of both lobes with multiple warm and cold nodules. No dominant hot nodules.  Dominant cold nodule at mid to upper pole of LEFT lobe corresponding to the largest nodule on ultrasound, though this nodule is stable in size from an earlier ultrasound from 2012.  IMPRESSION: Multinodular thyroid gland as above.    FNA left mid nodule 03/08/2014  Diagnosis THYROID, FINE NEEDLE ASPIRATION, LEFT MID (SPECIMEN 2 OF 2, COLLECTED ON 03/08/14): BENIGN. FINDINGS CONSISTENT WITH NON-NEOPLASTIC GOITER.    FNA Left inferior 03/08/2014 THYROID, FINE NEEDLE ASPIRATION, LEFT INFERIOR (SPECIMEN 1 OF 2, COLLECTED ON 03/08/14): BENIGN. FINDINGS CONSISTENT WITH NON-NEOPLASTIC GOITER.    FNA Left mid nodule 1.4 cm 02/26/2022  Clinical History: None provided  Specimen Submitted:  B. THYROID, LEFT MID, FINE NEEDLE ASPIRATION:    FINAL MICROSCOPIC DIAGNOSIS:  - Consistent with benign follicular nodule (Bethesda category II)   FNA Right Mid nodule 3.2 cm 02/26/2022   Clinical History: None provided Specimen Submitted:  A. THYROID, RIGHT MID, FINE NEEDLE ASPIRATION:   FINAL MICROSCOPIC DIAGNOSIS: - Consistent with benign follicular nodule (Bethesda category II)     ASSESSMENT/PLAN/RECOMMENDATIONS:   MNG:  -Patient has noted a local increase in neck size over the past few months as well as new onset left-sided discomfort and intermittent dysphagia.  -We discussed treatment options to include total thyroidectomy versus radiofrequency ablation -Patient opted for total thyroidectomy, she understands that she will need lifelong LT-for replacement with thyroidectomy.  -A referral has been placed to general  surgery - She is S/P benign FNA of the left mid and left inferior nodules in 2015 - She is S/P benign FNA of the left mid and right mid nodule 02/2022 -She is biochemically euthyroid   Follow-up in 6 months     Signed electronically by: Lyndle Herrlich, MD  [facility] Endocrinology  [facility] [address] [place] [place], [place] [address] Phone: [phone] FAX: [phone]   CC: Leilani Able, MD [address] [place] [address] Phone: [phone] Fax: [phone]   Return to Endocrinology clinic as below: No future appointments.

## 2022-07-24 ENCOUNTER — Ambulatory Visit: Payer: Commercial Managed Care - HMO

## 2022-08-05 ENCOUNTER — Ambulatory Visit: Payer: Self-pay | Admitting: Surgery

## 2022-08-05 DIAGNOSIS — E042 Nontoxic multinodular goiter: Secondary | ICD-10-CM | POA: Diagnosis present

## 2022-08-05 DIAGNOSIS — E049 Nontoxic goiter, unspecified: Secondary | ICD-10-CM | POA: Diagnosis present

## 2022-08-08 ENCOUNTER — Ambulatory Visit: Payer: 59 | Admitting: Internal Medicine

## 2022-08-14 ENCOUNTER — Ambulatory Visit: Payer: 59 | Admitting: Internal Medicine

## 2022-08-21 NOTE — Patient Instructions (Signed)
SURGICAL WAITING ROOM VISITATION  Patients having surgery or a procedure may have no more than 2 support people in the waiting area - these visitors may rotate.    Children under the age of 2 must have an adult with them who is not the patient.  Due to an increase in RSV and influenza rates and associated hospitalizations, children ages 62 and under may not visit patients in [place].  If the patient needs to stay at the hospital during part of their recovery, the visitor guidelines for inpatient rooms apply. Pre-op nurse will coordinate an appropriate time for 1 support person to accompany patient in pre-op.  This support person may not rotate.    Please refer to the [facility] website for the visitor guidelines for Inpatients (after your surgery is over and you are in a regular room).       Your procedure is scheduled on:  09/05/2022    Report to [facility] Main Entrance    Report to admitting at   1230 pm    Call this number if you have problems the morning of surgery [phone]   Do not eat food :After Midnight.   After Midnight you may have the following liquids until ___ 1130___ AM  DAY OF SURGERY  Water Non-Citrus Juices (without pulp, NO RED-Apple, White grape, White cranberry) Black Coffee (NO MILK/CREAM OR CREAMERS, sugar ok)  Clear Tea (NO MILK/CREAM OR CREAMERS, sugar ok) regular and decaf                             Plain Jell-O (NO RED)                                           Fruit ices (not with fruit pulp, NO RED)                                     Popsicles (NO RED)                                                               Sports drinks like Gatorade (NO RED)                          If you have questions, please contact your surgeon's office.     Oral Hygiene is also important to reduce your risk of infection.                                    Remember - BRUSH YOUR TEETH THE MORNING OF SURGERY WITH YOUR REGULAR  TOOTHPASTE  DENTURES WILL BE REMOVED PRIOR TO SURGERY PLEASE DO NOT APPLY "Poly grip" OR ADHESIVES!!!   Do NOT smoke after Midnight   Take these medicines the morning of surgery with A SIP OF WATER:   DO NOT TAKE ANY ORAL DIABETIC MEDICATIONS DAY OF YOUR SURGERY  Bring CPAP mask and tubing day of surgery.  You may not have any metal on your body including hair pins, jewelry, and body piercing             Do not wear make-up, lotions, powders, perfumes/cologne, or deodorant  Do not wear nail polish including gel and S&S, artificial/acrylic nails, or any other type of covering on natural nails including finger and toenails. If you have artificial nails, gel coating, etc. that needs to be removed by a nail salon please have this removed prior to surgery or surgery may need to be canceled/ delayed if the surgeon/ anesthesia feels like they are unable to be safely monitored.   Do not shave  48 hours prior to surgery.               Men may shave face and neck.   Do not bring valuables to the hospital. [place].   Contacts, glasses, dentures or bridgework may not be worn into surgery.   Bring small overnight bag day of surgery.   DO NOT [place]. PHARMACY WILL DISPENSE MEDICATIONS LISTED ON YOUR MEDICATION LIST TO YOU DURING YOUR ADMISSION [place]!    Patients discharged on the day of surgery will not be allowed to drive home.  Someone NEEDS to stay with you for the first 24 hours after anesthesia.   Special Instructions: Bring a copy of your healthcare power of attorney and living will documents the day of surgery if you haven't scanned them before.              Please read over the following fact sheets you were given: IF [place] [phone]   If you received a COVID test during your pre-op visit  it is  requested that you wear a mask when out in public, stay away from anyone that may not be feeling well and notify your surgeon if you develop symptoms. If you test positive for Covid or have been in contact with anyone that has tested positive in the last 10 days please notify you surgeon.    [place] - Preparing for Surgery Before surgery, you can play an important role.  Because skin is not sterile, your skin needs to be as free of germs as possible.  You can reduce the number of germs on your skin by washing with CHG (chlorahexidine gluconate) soap before surgery.  CHG is an antiseptic cleaner which kills germs and bonds with the skin to continue killing germs even after washing. Please DO NOT use if you have an allergy to CHG or antibacterial soaps.  If your skin becomes reddened/irritated stop using the CHG and inform your nurse when you arrive at Short Stay. Do not shave (including legs and underarms) for at least 48 hours prior to the first CHG shower.  You may shave your face/neck. Please follow these instructions carefully:  1.  Shower with CHG Soap the night before surgery and the  morning of Surgery.  2.  If you choose to wash your hair, wash your hair first as usual with your  normal  shampoo.  3.  After you shampoo, rinse your hair and body thoroughly to remove the  shampoo.  4.  Use CHG as you would any other liquid soap.  You can apply chg directly  to the skin and wash                       Gently with a scrungie or clean washcloth.  5.  Apply the CHG Soap to your body ONLY FROM THE NECK DOWN.   Do not use on face/ open                           Wound or open sores. Avoid contact with eyes, ears mouth and genitals (private parts).                       Wash face,  Genitals (private parts) with your normal soap.             6.  Wash thoroughly, paying special attention to the area where your surgery  will be performed.  7.  Thoroughly rinse your body with warm  water from the neck down.  8.  DO NOT shower/wash with your normal soap after using and rinsing off  the CHG Soap.                9.  Pat yourself dry with a clean towel.            10.  Wear clean pajamas.            11.  Place clean sheets on your bed the night of your first shower and do not  sleep with pets. Day of Surgery : Do not apply any lotions/deodorants the morning of surgery.  Please wear clean clothes to the hospital/surgery center.  FAILURE TO FOLLOW THESE INSTRUCTIONS MAY RESULT IN THE CANCELLATION OF YOUR SURGERY PATIENT SIGNATURE_________________________________  NURSE SIGNATURE__________________________________  ________________________________________________________________________

## 2022-08-21 NOTE — Progress Notes (Signed)
Anesthesia Review:  PCP: Cardiologist : Chest x-ray : EKG : Echo : Stress test: Cardiac Cath :  Activity level:  Sleep Study/ CPAP : Fasting Blood Sugar :      / Checks Blood Sugar -- times a day:   Blood Thinner/ Instructions /Last Dose: ASA / Instructions/ Last Dose :  

## 2022-08-25 NOTE — Patient Instructions (Signed)
SURGICAL WAITING ROOM VISITATION Patients having surgery or a procedure may have no more than 2 support people in the waiting area - these visitors may rotate.    If the patient needs to stay at the hospital during part of their recovery, the visitor guidelines for inpatient rooms apply. Pre-op nurse will coordinate an appropriate time for 1 support person to accompany patient in pre-op.  This support person may not rotate.    Please refer to the [facility] website for the visitor guidelines for Inpatients (after your surgery is over and you are in a regular room).   Due to an increase in RSV and influenza rates and associated hospitalizations, children ages 59 and under may not visit patients in [place].     Your procedure is scheduled on: 09-05-22   Report to [facility] Main Entrance    Report to admitting at 10:55 AM   Call this number if you have problems the morning of surgery [phone]   Do not eat food :After Midnight.   After Midnight you may have the following liquids until 10:00 AM DAY OF SURGERY  Water Non-Citrus Juices (without pulp, NO RED) Carbonated Beverages Black Coffee (NO MILK/CREAM OR CREAMERS, sugar ok)  Clear Tea (NO MILK/CREAM OR CREAMERS, sugar ok) regular and decaf                             Plain Jell-O (NO RED)                                           Fruit ices (not with fruit pulp, NO RED)                                     Popsicles (NO RED)                                                               Sports drinks like Gatorade (NO RED)                      If you have questions, please contact your surgeon's office.   FOLLOW  ANY ADDITIONAL PRE OP INSTRUCTIONS YOU RECEIVED FROM YOUR SURGEON'S OFFICE!!!     Oral Hygiene is also important to reduce your risk of infection.                                    Remember - BRUSH YOUR TEETH THE MORNING OF SURGERY WITH YOUR REGULAR TOOTHPASTE   Do NOT smoke after  Midnight   Take these medicines the morning of surgery with A SIP OF WATER:    None                              You may not have any metal on your body including hair pins, jewelry, and body piercing  Do not wear make-up, lotions, powders, perfumes or deodorant  Do not wear nail polish including gel and S&S, artificial/acrylic nails, or any other type of covering on natural nails including finger and toenails. If you have artificial nails, gel coating, etc. that needs to be removed by a nail salon please have this removed prior to surgery or surgery may need to be canceled/ delayed if the surgeon/ anesthesia feels like they are unable to be safely monitored.   Do not shave  48 hours prior to surgery.      Do not bring valuables to the hospital. [place].   Contacts, dentures or bridgework may not be worn into surgery.   Bring small overnight bag day of surgery.   DO NOT [place]. PHARMACY WILL DISPENSE MEDICATIONS LISTED ON YOUR MEDICATION LIST TO YOU DURING YOUR ADMISSION [place]!    Special Instructions: Bring a copy of your healthcare power of attorney and living will documents the day of surgery if you haven't scanned them before.              Please read over the following fact sheets you were given: IF [place] [phone]  If you received a COVID test during your pre-op visit  it is requested that you wear a mask when out in public, stay away from anyone that may not be feeling well and notify your surgeon if you develop symptoms. If you test positive for Covid or have been in contact with anyone that has tested positive in the last 10 days please notify you surgeon.  [place] - Preparing for Surgery Before surgery, you can play an important role.  Because skin is not sterile, your skin needs to be as free of germs as possible.  You  can reduce the number of germs on your skin by washing with CHG (chlorahexidine gluconate) soap before surgery.  CHG is an antiseptic cleaner which kills germs and bonds with the skin to continue killing germs even after washing. Please DO NOT use if you have an allergy to CHG or antibacterial soaps.  If your skin becomes reddened/irritated stop using the CHG and inform your nurse when you arrive at Short Stay. Do not shave (including legs and underarms) for at least 48 hours prior to the first CHG shower.  You may shave your face/neck.  Please follow these instructions carefully:  1.  Shower with CHG Soap the night before surgery and the  morning of surgery.  2.  If you choose to wash your hair, wash your hair first as usual with your normal  shampoo.  3.  After you shampoo, rinse your hair and body thoroughly to remove the shampoo.                             4.  Use CHG as you would any other liquid soap.  You can apply chg directly to the skin and wash.  Gently with a scrungie or clean washcloth.  5.  Apply the CHG Soap to your body ONLY FROM THE NECK DOWN.   Do   not use on face/ open                           Wound or open sores. Avoid contact with eyes, ears  mouth and   genitals (private parts).                       Wash face,  Genitals (private parts) with your normal soap.             6.  Wash thoroughly, paying special attention to the area where your    surgery  will be performed.  7.  Thoroughly rinse your body with warm water from the neck down.  8.  DO NOT shower/wash with your normal soap after using and rinsing off the CHG Soap.                9.  Pat yourself dry with a clean towel.            10.  Wear clean pajamas.            11.  Place clean sheets on your bed the night of your first shower and do not  sleep with pets. Day of Surgery : Do not apply any lotions/deodorants the morning of surgery.  Please wear clean clothes to the hospital/surgery center.  FAILURE TO FOLLOW  THESE INSTRUCTIONS MAY RESULT IN THE CANCELLATION OF YOUR SURGERY  PATIENT SIGNATURE_________________________________  NURSE SIGNATURE__________________________________  ________________________________________________________________________

## 2022-08-26 ENCOUNTER — Other Ambulatory Visit: Payer: Self-pay

## 2022-08-26 ENCOUNTER — Encounter (HOSPITAL_COMMUNITY): Payer: Self-pay

## 2022-08-26 ENCOUNTER — Encounter (HOSPITAL_COMMUNITY)
Admission: RE | Admit: 2022-08-26 | Discharge: 2022-08-26 | Disposition: A | Discharge status: Home / Self Care | Payer: Medicaid Other | Source: Ambulatory Visit | Attending: Family Medicine | Admitting: Family Medicine

## 2022-08-26 ENCOUNTER — Encounter (HOSPITAL_COMMUNITY)
Admission: RE | Admit: 2022-08-26 | Discharge: 2022-08-26 | Disposition: A | Discharge status: Home / Self Care | Payer: Medicaid Other | Source: Ambulatory Visit | Attending: Surgery | Admitting: Surgery

## 2022-08-26 VITALS — Ht 62.0 in | Wt 230.0 lb

## 2022-08-26 DIAGNOSIS — Z01818 Encounter for other preprocedural examination: Secondary | ICD-10-CM

## 2022-08-26 HISTORY — DX: Nontoxic goiter, unspecified: E04.9

## 2022-08-26 HISTORY — DX: Nontoxic single thyroid nodule: E04.1

## 2022-08-26 HISTORY — DX: Personal history of diseases of the blood and blood-forming organs and certain disorders involving the immune mechanism: Z86.2

## 2022-08-26 NOTE — Progress Notes (Signed)
For Anesthesia: PCP - Lin Landsman, MD  Cardiologist - N/A  Chest x-ray - N/A EKG - N/A Stress Test - N/A ECHO - N/A Cardiac Cath - N/A Pacemaker/ICD device last checked: N/A Pacemaker orders received: N/A Device Rep notified: N/A  Spinal Cord Stimulator: N/A  Sleep Study - N/A CPAP - N/A  Fasting Blood Sugar - N/A Checks Blood Sugar ___N/A__ times a day Date and result of last Hgb A1c-N/A  Last dose of GLP1 agonist-  N/A GLP1 instructions:  N/A  Last dose of SGLT-2 inhibitors- N/A SGLT-2 instructions: N/A  Blood Thinner Instructions: N/A Aspirin Instructions: N/A Last Dose: N/A  Activity level: Can go up a flight of stairs and activities of daily living without stopping and without chest pain and/or shortness of breath  Anesthesia review: N/A  Patient denies shortness of breath, fever, cough and chest pain at PAT appointment   Patient verbalized understanding of instructions reviewed via telephone.

## 2022-08-30 ENCOUNTER — Encounter (HOSPITAL_COMMUNITY): Payer: Self-pay | Admitting: Surgery

## 2022-08-30 DIAGNOSIS — E042 Nontoxic multinodular goiter: Secondary | ICD-10-CM | POA: Diagnosis present

## 2022-08-30 DIAGNOSIS — E049 Nontoxic goiter, unspecified: Secondary | ICD-10-CM | POA: Diagnosis present

## 2022-08-30 NOTE — H&P (Signed)
REFERRING PHYSICIAN: Shamleffer, Ibtehal  PROVIDER: Delmos Velaquez Charlotta Newton, MD   Chief Complaint: New Consultation (Multinodular thyroid goiter)  History of Present Illness:  Patient is referred by Dr. Vivia Ewing for surgical evaluation and management of an enlarging multinodular thyroid goiter. Patient was originally diagnosed in 2004. She was followed by endocrinology with sequential ultrasound studies. She has had progressive enlargement of a bilateral multinodular thyroid goiter. Her most recent ultrasound was from June 2023 showing a left thyroid lobe measuring 9.3 cm and a right thyroid lobe measuring 7.8 cm. Bilateral nodules underwent fine-needle aspiration biopsy with benign cytopathology. Thyroid function has been normal with her most recent TSH level being 0.69. Patient has never been on thyroid medication. She has had no prior head or neck surgery. There is a history of a paternal aunt with medical thyroid disease requiring medication. Patient has noted some mild compressive symptoms including mild dysphagia, particularly with liquids. She denies any dyspnea. She denies any globus sensation. Patient presents today accompanied by her daughter to discuss thyroid surgery.  Review of Systems: A complete review of systems was obtained from the patient. I have reviewed this information and discussed as appropriate with the patient. See HPI as well for other ROS.  Review of Systems Constitutional: Negative. HENT: Negative. Eyes: Negative. Respiratory: Negative. Cardiovascular: Negative. Gastrointestinal: Dysphagia Genitourinary: Negative. Musculoskeletal: Negative. Skin: Negative. Neurological: Negative. Endo/Heme/Allergies: Negative. Psychiatric/Behavioral: Negative.   Medical History: Past Medical History: Diagnosis Date Thyroid disease  Patient Active Problem List Diagnosis Enlarged thyroid gland Multiple thyroid nodules  Past Surgical History: Procedure  Laterality Date CESAREAN SECTION 01/25/2001 REPEAT CESAREAN SECTION 09/17/2005   No Known Allergies  Current Outpatient Medications on File Prior to Visit Medication Sig Dispense Refill cholecalciferol (VITAMIN D3) 5,000 unit capsule Take by mouth  No current facility-administered medications on file prior to visit.  Family History Problem Relation Age of Onset High blood pressure (Hypertension) Mother Diabetes Mother Coronary Artery Disease (Blocked arteries around heart) Father High blood pressure (Hypertension) Sister Hyperlipidemia (Elevated cholesterol) Sister Diabetes Sister Coronary Artery Disease (Blocked arteries around heart) Sister   Social History  Tobacco Use Smoking Status Never Smokeless Tobacco Never   Social History  Socioeconomic History Marital status: Married Tobacco Use Smoking status: Never Smokeless tobacco: Never Substance and Sexual Activity Alcohol use: Never Drug use: Never  Objective:  Vitals: BP: (!) 144/92 Pulse: 106 Temp: 37.1 C (98.7 F) SpO2: 98% Weight: (!) 108 kg (238 lb) Height: 157.5 cm (5\' 2" )  Body mass index is 43.53 kg/m.  Physical Exam  GENERAL APPEARANCE Comfortable, no acute issues Development: normal Gross deformities: none  SKIN Rash, lesions, ulcers: none Induration, erythema: none Nodules: none palpable  EYES Conjunctiva and lids: normal Pupils: equal and reactive  EARS, NOSE, MOUTH, THROAT External ears: no lesion or deformity External nose: no lesion or deformity Hearing: grossly normal  NECK Symmetric: no Trachea: midline Thyroid: Thyroid gland is diffusely enlarged and multinodular without discrete or dominant mass. Left lobe is slightly larger than the right. Gland is relatively firm. Left lobe extends superiorly and anteriorly where the right lobe is located more deeply in the neck and extends beneath the right clavicle. There is no associated lymphadenopathy. There is no  tenderness.  CHEST Respiratory effort: normal Retraction or accessory muscle use: no Breath sounds: normal bilaterally Rales, rhonchi, wheeze: none  CARDIOVASCULAR Auscultation: regular rhythm, normal rate Murmurs: none Pulses: radial pulse 2+ palpable Lower extremity edema: none  ABDOMEN Not assessed  GENITOURINARY/RECTAL Not assessed  MUSCULOSKELETAL Station and gait: normal Digits and nails: no clubbing or cyanosis Muscle strength: grossly normal all extremities Range of motion: grossly normal all extremities Deformity: none  LYMPHATIC Cervical: none palpable Supraclavicular: none palpable  PSYCHIATRIC Oriented to person, place, and time: yes Mood and affect: normal for situation Judgment and insight: appropriate for situation   Assessment and Plan:  Enlarged thyroid gland Multiple thyroid nodules  Patient is referred by her endocrinologist for surgical evaluation and recommendations regarding management of a symptomatic multinodular thyroid goiter.  Patient provided with a copy of "The Thyroid Book: Medical and Surgical Treatment of Thyroid Problems", published by [employer], 16 pages. Book reviewed and explained to patient during visit today.  Today we discussed her clinical history, we reviewed her imaging studies, and we reviewed her laboratory studies. We had an extensive discussion regarding options for management. I explained to her that she did not have an absolute indication for thyroidectomy. She could continue medical management. However, if she opted for surgical treatment, I have recommended total thyroidectomy. We discussed the procedure in detail. We discussed the risk and benefits including the risk of recurrent laryngeal nerve injury and injury to parathyroid glands. We discussed the size and location of the surgical incision. We discussed the hospital stay to be anticipated. We discussed the need for lifelong thyroid hormone replacement therapy. The  patient understands and wishes to proceed with surgery in the near future.  Armandina Gemma, MD [facility] A [place] practice Office: [phone]

## 2022-09-04 ENCOUNTER — Ambulatory Visit: Payer: 59 | Admitting: Internal Medicine

## 2022-09-05 ENCOUNTER — Encounter (HOSPITAL_COMMUNITY): Admission: RE | Payer: Self-pay | Source: Home / Self Care

## 2022-09-05 ENCOUNTER — Ambulatory Visit (HOSPITAL_COMMUNITY): Admission: RE | Admit: 2022-09-05 | Payer: BLUE CROSS/BLUE SHIELD | Source: Home / Self Care | Admitting: Surgery

## 2022-09-05 DIAGNOSIS — E049 Nontoxic goiter, unspecified: Secondary | ICD-10-CM | POA: Diagnosis present

## 2022-09-05 DIAGNOSIS — E042 Nontoxic multinodular goiter: Secondary | ICD-10-CM | POA: Diagnosis present

## 2022-09-05 SURGERY — THYROIDECTOMY
Anesthesia: General

## 2023-01-19 ENCOUNTER — Encounter: Payer: Self-pay | Admitting: Internal Medicine

## 2023-01-19 ENCOUNTER — Ambulatory Visit (INDEPENDENT_AMBULATORY_CARE_PROVIDER_SITE_OTHER): Payer: 59 | Admitting: Internal Medicine

## 2023-01-19 VITALS — BP 126/80 | HR 74 | Ht 62.0 in | Wt 231.0 lb

## 2023-01-19 DIAGNOSIS — E042 Nontoxic multinodular goiter: Secondary | ICD-10-CM

## 2023-01-19 DIAGNOSIS — R635 Abnormal weight gain: Secondary | ICD-10-CM

## 2023-01-19 DIAGNOSIS — E559 Vitamin D deficiency, unspecified: Secondary | ICD-10-CM | POA: Diagnosis not present

## 2023-01-19 NOTE — Progress Notes (Unsigned)
Name: Sarah Travis  MRN/ DOB: [id number], 1971/07/20    Age/ Sex: 52 y.o., female    PCP: Leilani Able, MD   Reason for Endocrinology Evaluation: MNG     Date of Initial Endocrinology Evaluation: 02/05/2022    HPI: [name] is a 52 y.o. female with a past medical history of MNG. The patient presented for initial endocrinology clinic visit on 02/05/2022 for consultative assistance with her MNG.   She was diagnosed with multinodular goiter in 2012 based on thyroid ultrasound.  She also had thyroid uptake and scan which was normal at 12% uptake but consistent with multinodular goiter, and a left dominant cold nodule. Left  Cold nodule was biopsied in 2015 with benign cytology She is also S/P benign FNA of the left inferior nodule in 2015   She is S/P benign FNA of the left mid and right mid nodule 02/2022  Paternal aunt with thyroid disease     SUBJECTIVE:    Today (01/19/23): Ms . Sarah Travis is here for a follow up on MNG  She met with Dr. Gerrit Friends 07/2022, surgery was scheduled for 09/05/2022 but it was canceled, due to insurance issues and a high co-pay of $22,000  Denies local neck swelling  Denies palpitations  Denies tremors  Denies diarrhea or constipation Has minimal what sounds like vertigo for the past year  Has noted weight gain , despite eating healthy and exercising  B complex daily Vitamin D daily   HISTORY:  Past Medical History:  Past Medical History:  Diagnosis Date   Enlarged thyroid    High cholesterol    History of iron deficiency anemia    Thyroid goiter    Thyroid nodule    Multiple   Vitamin D deficiency    Past Surgical History:  Past Surgical History:  Procedure Laterality Date   CESAREAN SECTION     DILATION AND CURETTAGE OF UTERUS     Miscarriage    Social History:  reports that she has never smoked. She has never used smokeless tobacco. She reports that she does not drink alcohol and does not use  drugs. Family History: family history includes Cancer in her mother; Heart failure in her father.   HOME MEDICATIONS: Allergies as of 01/19/2023   No Known Allergies      Medication List        Accurate as of January 19, 2023  1:47 PM. If you have any questions, ask your nurse or doctor.          STOP taking these medications    fluticasone 50 MCG/ACT nasal spray Commonly known as: FLONASE Stopped by: Scarlette Shorts, MD       TAKE these medications    b complex vitamins capsule Take 1 capsule by mouth daily.   cetirizine 10 MG tablet Commonly known as: ZyrTEC Allergy Take 1 tablet (10 mg total) by mouth at bedtime.   Dialyvite Vitamin D 5000 125 MCG (5000 UT) capsule Generic drug: Cholecalciferol Take 5,000 Units by mouth daily.          REVIEW OF SYSTEMS: A comprehensive ROS was conducted with the patient and is negative except as per HPI    OBJECTIVE:  VS: BP 126/80 (BP Location: Left Arm, Patient Position: Sitting, Cuff Size: Large)   Pulse 74   Ht 5\' 2"  (1.575 m)   Wt 231 lb (104.8 kg)   SpO2 96%   BMI 42.25 kg/m  Wt Readings from Last 3 Encounters:  01/19/23 231 lb (104.8 kg)  08/26/22 230 lb (104.3 kg)  07/18/22 239 lb (108.4 kg)     EXAM: General: Pt appears well and is in NAD  Neck: General: Supple without adenopathy. Thyroid: Bilateral thyroid nodules appreciated  Lungs: Clear with good BS bilat with no rales, rhonchi, or wheezes  Heart: Auscultation: RRR.  Abdomen: Normoactive bowel sounds, soft, nontender, without masses or organomegaly palpable  Extremities:  BL LE: No pretibial edema normal ROM and strength.  Mental Status: Judgment, insight: Intact Orientation: Oriented to time, place, and person Mood and affect: No depression, anxiety, or agitation     DATA REVIEWED:   Latest Reference Range & Units 01/19/23 14:12  Sodium 135 - 145 mEq/L 141  Potassium 3.5 - 5.1 mEq/L 4.9  Chloride 96 - 112 mEq/L 102  CO2 19 -  32 mEq/L 31  Glucose 70 - 99 mg/dL 161 (H)  BUN 6 - 23 mg/dL 15  Creatinine 0.96 - 0.45 mg/dL 4.09  Calcium 8.4 - 81.1 mg/dL 9.8  Alkaline Phosphatase 39 - 117 U/L 86  Albumin 3.5 - 5.2 g/dL 4.1  AST 0 - 37 U/L 19  ALT 0 - 35 U/L 15  Total Protein 6.0 - 8.3 g/dL 7.2  Total Bilirubin 0.2 - 1.2 mg/dL 0.2  GFR >91.47 mL/min 109.24  VITD 30.00 - 100.00 ng/mL 26.57 (L)  Vitamin B12 200 - 1,100 pg/mL 304  WBC 4.0 - 10.5 K/uL 6.0  RBC 3.87 - 5.11 Mil/uL 4.46  Hemoglobin 12.0 - 15.0 g/dL 82.9  HCT 56.2 - 13.0 % 38.9  MCV 78.0 - 100.0 fl 87.2  MCHC 30.0 - 36.0 g/dL 86.5  RDW 78.4 - 69.6 % 13.4  Platelets 150.0 - 400.0 K/uL 339.0  TSH 0.35 - 5.50 uIU/mL 0.36  Triiodothyronine (T3) 76 - 181 ng/dL 295  M8,UXLK(GMWNUU) 7.25 - 1.60 ng/dL 3.66  (H): Data is abnormally high (L): Data is abnormally low   Thyroid Ultrasound 02/08/2022  Estimated total number of nodules >/= 1 cm: 6-10   Number of spongiform nodules >/=  2 cm not described below (TR1): 0   Number of mixed cystic and solid nodules >/= 1.5 cm not described below (TR2): 0   _________________________________________________________   Nodule # 1:   Location: RIGHT; Mid   Maximum size: 3.2 cm; Other 2 dimensions: 2.4 x 2.3 cm previously 2.0 x 1.7 x 2.2 cm   Composition: solid/almost completely solid (2)   Echogenicity: isoechoic (1)   Shape: not taller-than-wide (0)   Margins: ill-defined (0)   Echogenic foci: none (0)   ACR TI-RADS total points: 3.   ACR TI-RADS risk category: TR3 (3 points).   ACR TI-RADS recommendations:   **Given size (>/= 2.5 cm) and appearance, fine needle aspiration of this mildly suspicious nodule should be considered based on TI-RADS criteria.   _________________________________________________________   Nodule # 2:   Location: RIGHT; Mid   Maximum size: 1.4 cm; Other 2 dimensions: 1.0 x 1.2 cm previously 1.5 x 0.9 x 1.6 cm   Composition: solid/almost completely solid (2)    Echogenicity: isoechoic (1)   Shape: not taller-than-wide (0)   Margins: ill-defined (0)   Echogenic foci: none (0)   ACR TI-RADS total points: 3.   ACR TI-RADS risk category: TR3 (3 points).   ACR TI-RADS recommendations:   Given size (<1.4 cm) and appearance, this nodule does NOT meet TI-RADS criteria for biopsy or dedicated follow-up.   _________________________________________________________  Nodule # 3:   Location: RIGHT; Inferior   Maximum size: 2.9 cm; Other 2 dimensions: 2.5 x 2.6 cm previously 2.8 x 2.4 x 2.5 cm   Composition: solid/almost completely solid (2)   Echogenicity: isoechoic (1)   Shape: not taller-than-wide (0)   Margins: ill-defined (0)   Echogenic foci: none (0)   ACR TI-RADS total points: 3.   ACR TI-RADS risk category: TR3 (3 points).   ACR TI-RADS recommendations:   **Given size (>/= 2.5 cm) and appearance, fine needle aspiration of this mildly suspicious nodule should be considered based on TI-RADS criteria.   _________________________________________________________   Nodule # 4:   Location: LEFT; Mid   Maximum size: 2.4 cm; Other 2 dimensions: 2.1 x 2 1 cm previously 3.8 x 1.9 x 3.9 cm   No aggressive features on today's evaluation. This nodule was previously biopsied in 03/08/2014.   Assuming a benign pathologic diagnosis, repeat sampling and/or dedicated follow-up is not recommended.   _________________________________________________________   Nodule # 5:   Location: LEFT; Mid   Maximum size: 4.1 cm; Other 2 dimensions: 2.8 x 3.2 cm previously 3.7 x 2.8 x 3.4 cm   No aggressive features on today's evaluation. This nodule was previously biopsied in 03/08/2014.   Assuming a benign pathologic diagnosis, repeat sampling and/or dedicated follow-up is not recommended.   _________________________________________________________   Nodule # 6:   Location: LEFT; Mid   Maximum size: 1.4 cm; Other 2 dimensions:  1.2 x 1.2 cm previously 1.6 x 1.3 x 1.4 cm   Composition: solid/almost completely solid (2)   Echogenicity: isoechoic (1)   Shape: not taller-than-wide (0)   Margins: ill-defined (0)   Echogenic foci: macrocalcifications (1)   ACR TI-RADS total points: 4.   ACR TI-RADS risk category: TR4 (4-6 points).   ACR TI-RADS recommendations:   **Given size (>/= 1.0 cm) and appearance, fine needle aspiration of this highly suspicious nodule should be considered based on TI-RADS criteria.   _________________________________________________________   No cervical adenopathy or abnormal fluid collection within the imaged neck.   IMPRESSION: 1. Enlarged multinodular thyroid. 2. 3.2 cm RIGHT mid TR-3 (#1), 2.9 cm RIGHT inferior TR-3 (#3) and 1.4 cm LEFT mid TR-4 (#6) thyroid nodules. FNA biopsy of these highly suspicious nodules should be considered based on TI-RADS criteria. 3. Nodules labeled #4 and #5 on today's evaluation were previously biopsied in 03/08/2014. Assuming a benign pathologic diagnosis, repeat sampling and/or dedicated follow-up is not recommended.    02/16/2014 Uptake and Scan: 24 hour radioiodine uptake calculated at 12%, normal.  Images of the thyroid gland demonstrate a multinodular appearance of both lobes with multiple warm and cold nodules. No dominant hot nodules.  Dominant cold nodule at mid to upper pole of LEFT lobe corresponding to the largest nodule on ultrasound, though this nodule is stable in size from an earlier ultrasound from 2012.  IMPRESSION: Multinodular thyroid gland as above.    FNA left mid nodule 03/08/2014  Diagnosis THYROID, FINE NEEDLE ASPIRATION, LEFT MID (SPECIMEN 2 OF 2, COLLECTED ON 03/08/14): BENIGN. FINDINGS CONSISTENT WITH NON-NEOPLASTIC GOITER.    FNA Left inferior 03/08/2014 THYROID, FINE NEEDLE ASPIRATION, LEFT INFERIOR (SPECIMEN 1 OF 2, COLLECTED ON 03/08/14): BENIGN. FINDINGS CONSISTENT WITH NON-NEOPLASTIC  GOITER.    FNA Left mid nodule 1.4 cm 02/26/2022  Clinical History: None provided  Specimen Submitted:  B. THYROID, LEFT MID, FINE NEEDLE ASPIRATION:    FINAL MICROSCOPIC DIAGNOSIS:  - Consistent with benign follicular nodule (Bethesda category II)  FNA Right Mid nodule 3.2 cm 02/26/2022   Clinical History: None provided Specimen Submitted:  A. THYROID, RIGHT MID, FINE NEEDLE ASPIRATION:   FINAL MICROSCOPIC DIAGNOSIS: - Consistent with benign follicular nodule (Bethesda category II)     ASSESSMENT/PLAN/RECOMMENDATIONS:   MNG:  -Patient is interested in surgical intervention, unfortunately due to insurance issues she has not been able to proceed with total thyroidectomy yet, but she is planning on proceeding with this before the end of the year  -We had discussed treatment options to include total thyroidectomy versus radiofrequency ablation but the patient opted with total thyroidectomy -She is concerned about complications, her friend developed hypocalcemia, did explain to the patient that hypocalcemia as well as vocal cord injury is a rare complication - She is S/P benign FNA of the left mid and left inferior nodules in 2015 - She is S/P benign FNA of the left mid and right mid nodule 02/2022 -She is biochemically euthyroid   2.  Weight gain:   -A referral has been placed to [place] [facility] -CBC, CMP, vitamin B-12 all come back normal   3.  Vitamin D insufficiency  -She is already on vitamin D, unknown dose, patient was asked to increase the dose  Follow-up in 7 months, or 2 months postoperatively  I spent 45 minutes preparing to see the patient by review of recent labs, imaging and procedures, obtaining and reviewing separately obtained history, communicating with the patient/family or caregiver, ordering medications, tests or procedures, and documenting clinical information in the EHR including the differential Dx, treatment, and any further  evaluation and other management     Signed electronically by: Lyndle Herrlich, MD  [facility]  [facility] [address] [place] [place], [place] [address] Phone: [phone] FAX: [phone]   CC: Leilani Able, MD [address] [place] [address] Phone: [phone] Fax: [phone]   Return to Endocrinology clinic as below: No future appointments.

## 2023-01-20 DIAGNOSIS — Z0289 Encounter for other administrative examinations: Secondary | ICD-10-CM

## 2023-01-20 LAB — COMPREHENSIVE METABOLIC PANEL
ALT: 15 U/L (ref 0–35)
AST: 19 U/L (ref 0–37)
Albumin: 4.1 g/dL (ref 3.5–5.2)
Alkaline Phosphatase: 86 U/L (ref 39–117)
BUN: 15 mg/dL (ref 6–23)
CO2: 31 mEq/L (ref 19–32)
Calcium: 9.8 mg/dL (ref 8.4–10.5)
Chloride: 102 mEq/L (ref 96–112)
Creatinine, Ser: 0.46 mg/dL (ref 0.40–1.20)
GFR: 109.24 mL/min (ref >60.00)
Glucose, Bld: 103 mg/dL — ABNORMAL HIGH (ref 70–99)
Potassium: 4.9 mEq/L (ref 3.5–5.1)
Sodium: 141 mEq/L (ref 135–145)
Total Bilirubin: 0.2 mg/dL (ref 0.2–1.2)
Total Protein: 7.2 g/dL (ref 6.0–8.3)

## 2023-01-20 LAB — CBC
HCT: 38.9 % (ref 36.0–46.0)
Hemoglobin: 12.3 g/dL (ref 12.0–15.0)
MCHC: 31.7 g/dL (ref 30.0–36.0)
MCV: 87.2 fl (ref 78.0–100.0)
Platelets: 339 10*3/uL (ref 150.0–400.0)
RBC: 4.46 Mil/uL (ref 3.87–5.11)
RDW: 13.4 % (ref 11.5–15.5)
WBC: 6 10*3/uL (ref 4.0–10.5)

## 2023-01-20 LAB — VITAMIN D 25 HYDROXY (VIT D DEFICIENCY, FRACTURES): VITD: 26.57 ng/mL — ABNORMAL LOW (ref 30.00–100.00)

## 2023-01-20 LAB — T3: T3, Total: 100 ng/dL (ref 76–181)

## 2023-01-20 LAB — TSH: TSH: 0.36 u[IU]/mL (ref 0.35–5.50)

## 2023-01-20 LAB — VITAMIN B12: Vitamin B-12: 304 pg/mL (ref 200–1100)

## 2023-01-20 LAB — T4, FREE: Free T4: 0.69 ng/dL (ref 0.60–1.60)

## 2023-01-21 ENCOUNTER — Encounter (INDEPENDENT_AMBULATORY_CARE_PROVIDER_SITE_OTHER): Payer: Self-pay | Admitting: Physician Assistant

## 2023-01-21 ENCOUNTER — Ambulatory Visit (INDEPENDENT_AMBULATORY_CARE_PROVIDER_SITE_OTHER): Payer: 59 | Admitting: Physician Assistant

## 2023-01-21 VITALS — BP 121/83 | HR 88 | Ht 60.5 in | Wt 225.0 lb

## 2023-01-21 DIAGNOSIS — E042 Nontoxic multinodular goiter: Secondary | ICD-10-CM | POA: Diagnosis not present

## 2023-01-21 DIAGNOSIS — E669 Obesity, unspecified: Secondary | ICD-10-CM | POA: Diagnosis not present

## 2023-01-21 NOTE — Progress Notes (Signed)
Office: [phone]  /  Fax: [phone]   Initial Visit  Sarah Travis was seen in clinic today to evaluate for obesity. She is interested in losing weight to improve overall health and reduce the risk of weight related complications. She presents today to review program treatment options, initial physical assessment, and evaluation.     She was referred by: Specialist- Dr. Raelyn Mora- Endocrinologist  When asked what else they would like to accomplish? She states: Adopt healthier eating patterns, Improve quality of life, Improve appearance, and Improve self-confidence  Weight history: Difficulty losing weight after birth of youngest daughter in 2007 and then worsening weight gain for past 6-7 years  When asked how has your weight affected you? She states: Has affected self-esteem, Relationships, Contributed to orthopedic problems or mobility issues, and Has affected mood   Some associated conditions: Other: Multi-nodular goiter with normal thyroid function studies  Contributing factors: Family history, Reduced physical activity, and Eating patterns  Weight promoting medications identified: None  Current nutrition plan: None  Current level of physical activity: Walking  Current or previous pharmacotherapy: None  Response to medication: Never tried medications   Past medical history includes:   Past Medical History:  Diagnosis Date   Enlarged thyroid    High cholesterol    History of iron deficiency anemia    Thyroid goiter    Thyroid nodule    Multiple   Vitamin D deficiency      Objective:   There were no vitals taken for this visit. She was weighed on the bioimpedance scale: There is no height or weight on file to calculate BMI.  Peak Weight:237 lbs , Body Fat%:49.8, Visceral Fat Rating:16, Weight trend over the last 12 months: Increasing  General:  Alert, oriented and cooperative. Patient is in no acute distress.  Respiratory: Normal respiratory  effort, no problems with respiration noted   Gait: able to ambulate independently  Mental Status: Normal mood and affect. Normal behavior. Normal judgment and thought content.   DIAGNOSTIC DATA REVIEWED:  BMET    Component Value Date/Time   NA 141 01/19/2023 1412   K 4.9 01/19/2023 1412   CL 102 01/19/2023 1412   CO2 31 01/19/2023 1412   GLUCOSE 103 (H) 01/19/2023 1412   BUN 15 01/19/2023 1412   CREATININE 0.46 01/19/2023 1412   CALCIUM 9.8 01/19/2023 1412   Lab Results  Component Value Date   HGBA1C 6.0 (H) 10/18/2010   No results found for: "INSULIN" CBC    Component Value Date/Time   WBC 6.0 01/19/2023 1412   RBC 4.46 01/19/2023 1412   HGB 12.3 01/19/2023 1412   HCT 38.9 01/19/2023 1412   PLT 339.0 01/19/2023 1412   MCV 87.2 01/19/2023 1412   MCHC 31.7 01/19/2023 1412   RDW 13.4 01/19/2023 1412   Iron/TIBC/Ferritin/ %Sat    Component Value Date/Time   FERRITIN 81 10/18/2010 2026   Lipid Panel  No results found for: "CHOL", "TRIG", "HDL", "CHOLHDL", "VLDL", "LDLCALC", "LDLDIRECT" Hepatic Function Panel     Component Value Date/Time   PROT 7.2 01/19/2023 1412   ALBUMIN 4.1 01/19/2023 1412   AST 19 01/19/2023 1412   ALT 15 01/19/2023 1412   ALKPHOS 86 01/19/2023 1412   BILITOT 0.2 01/19/2023 1412      Component Value Date/Time   TSH 0.36 01/19/2023 1412     Assessment and Plan:   Multiple thyroid nodules  Obesity (HCC)- BMI -   Multi-nodular goiter: She was diagnosed with multinodular goiter  in 2012 based on thyroid ultrasound.   She also had thyroid uptake and scan which was normal at 12% uptake but consistent with multinodular goiter, and a left dominant cold nodule. Left  Cold nodule was biopsied in 2015 with benign cytology She is also S/P benign FNA of the left inferior nodule in 2015  She is S/P benign FNA of the left mid and right mid nodule 02/2022   She was to have thyroidectomy by Dr. Gerrit Friends in 08/2022, but unable to proceed due to cost.   Plan: Continue regular follow up with Dr. Lonzo Cloud  as directed. Will follow with Endocrinology.      Obesity Treatment / Action Plan:  Patient will work on garnering support from family and friends to begin weight loss journey. Will work on eliminating or reducing the presence of highly palatable, calorie dense foods in the home. Will complete provided nutritional and psychosocial assessment questionnaire before the next appointment. Will be scheduled for indirect calorimetry to determine resting energy expenditure in a fasting state.  This will allow [place] to create a reduced calorie, high-protein meal plan to promote loss of fat mass while preserving muscle mass. Counseled on the health benefits of losing 5%-15% of total body weight. Was counseled on nutritional approaches to weight loss and benefits of reducing processed foods and consuming plant-based foods and high quality protein as part of nutritional weight management. Was counseled on pharmacotherapy and role as an adjunct in weight management.   Obesity Education Performed Today:  She was weighed on the bioimpedance scale and results were discussed and documented in the synopsis.  We discussed obesity as a disease and the importance of a more detailed evaluation of all the factors contributing to the disease.  We discussed the importance of long term lifestyle changes which include nutrition, exercise and behavioral modifications as well as the importance of customizing this to her specific health and social needs.  We discussed the benefits of reaching a healthier weight to alleviate the symptoms of existing conditions and reduce the risks of the biomechanical, metabolic and psychological effects of obesity.  Sarah Travis appears to be in the action stage of change and states they are ready to start intensive lifestyle modifications and behavioral modifications.  30 minutes was spent today on this visit including the  above counseling, pre-visit chart review, and post-visit documentation.  Reviewed by clinician on day of visit: allergies, medications, problem list, medical history, surgical history, family history, social history, and previous encounter notes pertinent to obesity diagnosis.   Lorenna Lurry,PA-C

## 2023-02-11 ENCOUNTER — Ambulatory Visit (INDEPENDENT_AMBULATORY_CARE_PROVIDER_SITE_OTHER): Payer: 59 | Admitting: Family Medicine

## 2023-02-11 ENCOUNTER — Encounter (INDEPENDENT_AMBULATORY_CARE_PROVIDER_SITE_OTHER): Payer: Self-pay | Admitting: Family Medicine

## 2023-02-11 VITALS — BP 132/82 | HR 80 | Temp 98.4°F | Ht 60.5 in | Wt 226.0 lb

## 2023-02-11 DIAGNOSIS — E7849 Other hyperlipidemia: Secondary | ICD-10-CM | POA: Diagnosis not present

## 2023-02-11 DIAGNOSIS — E65 Localized adiposity: Secondary | ICD-10-CM

## 2023-02-11 DIAGNOSIS — Z6841 Body Mass Index (BMI) 40.0 and over, adult: Secondary | ICD-10-CM | POA: Diagnosis not present

## 2023-02-11 DIAGNOSIS — D649 Anemia, unspecified: Secondary | ICD-10-CM

## 2023-02-11 DIAGNOSIS — F5089 Other specified eating disorder: Secondary | ICD-10-CM | POA: Diagnosis not present

## 2023-02-11 DIAGNOSIS — E0520 Thyrotoxicosis with toxic multinodular goiter without thyrotoxic crisis or storm: Secondary | ICD-10-CM

## 2023-02-11 DIAGNOSIS — R5383 Other fatigue: Secondary | ICD-10-CM | POA: Diagnosis not present

## 2023-02-11 DIAGNOSIS — Z1331 Encounter for screening for depression: Secondary | ICD-10-CM

## 2023-02-11 DIAGNOSIS — R0602 Shortness of breath: Secondary | ICD-10-CM | POA: Diagnosis not present

## 2023-02-11 DIAGNOSIS — E559 Vitamin D deficiency, unspecified: Secondary | ICD-10-CM

## 2023-02-11 NOTE — Progress Notes (Signed)
Carlye Grippe, D.O.  ABFM, ABOM Specializing in Clinical Bariatric Medicine Office located at: [address]  [place], [place]  [address]     Bariatric Medicine Visit  Dear Leilani Able, MD   Thank you for referring Sarah Travis to our clinic today for evaluation.  We performed a consultation to discuss her options for treatment and educate the patient on her disease state.  The following note includes my evaluation and treatment recommendations.   Please do not hesitate to reach out to me directly if you have any further concerns.    Assessment and Plan:   Orders Placed This Encounter  Procedures   Folate   Hemoglobin A1c   Insulin, random   Lipid Panel With LDL/HDL Ratio   EKG 12-Lead    Medications Discontinued During This Encounter  Medication Reason   cetirizine (ZYRTEC ALLERGY) 10 MG tablet      1) Fatigue Assessment: Condition is Not optimized. Sarah Travis does feel that her weight is causing her energy to be lower than it should be. Fatigue may be related to obesity, depression or many other causes. she does not appear to have any red flag symptoms and this appears to most likely be related to her current lifestyle habits and dietary intake.  Plan:  Labs will be ordered and reviewed with her at their next office visit in two weeks. Epworth sleepiness scale score appears to be within normal limits.  Her ESS score is 3 . Sarah Travis denies daytime somnolence and denies waking up still tired. Sarah Travis generally gets 5 hours of sleep per night, and states that she has generally restful sleep. Snoring is sometimes present. Apneic episodes is not present.  ECG: Normal sinus rhythm, rate 80 bpm; reassuring without any acute abnormalities, will continue to monitor for symptoms  Depression screen/Modified PHQ-9 Depression Screen: Her Food and Mood (modified PHQ-9) score was 12 . In the meanwhile, Sarah Travis will focus on self care including making healthy food choices by  following their meal plan, improving sleep quality and focusing on stress reduction.  Once we are assured she is on an appropriate meal plan, we will start discussing exercise to increase cardiovascular fitness levels.    2) Shortness of breath on exertion Assessment: Condition is not optimized. Sarah Travis does feel that she gets out of breath more easily than she used to when she exercises and seems to be worsening over time with weight gain.  This has gotten worse recently. Sarah Travis denies shortness of breath at rest or orthopnea. Levia's shortness of breath appears to be obesity related and exercise induced, as they do not appear to have any "red flag" symptoms/ concerns today.  Also, this condition appears to be related to a state of poor cardiovascular conditioning   Plan:  Obtain labs today and will be reviewed with her at their next office visit in two weeks. Indirect Calorimeter completed today to help guide our dietary regimen. It shows a VO2 of 183 and a REE of 1267.  Her calculated basal metabolic rate is 2952 thus her measured basal metabolic rate is worse than expected. Patient agreed to work on weight loss at this time.  As Cyani progresses through our weight loss program, we will gradually increase exercise as tolerated to treat her current condition.   If Jasha follows our recommendations and loses 5-10% of their weight without improvement of her shortness of breath or if at any time, symptoms become more concerning, they agree to urgently follow up  with their PCP/ specialist for further consideration/ evaluation.   Sarah Travis verbalizes agreement with this plan.    Other hyperlipidemia Assessment: Pt endorses being on cholesterol medication in the past, however she stopped the medication on her own because her cholesterol levels have been stable. She reports significantly decreasing her fatty carbohydrate intake.   Plan: Check labs.   Begin our treatment plan of a heart-heathy, low cholesterol  meal plan. We will begin. Will begin routine screening as patient continues to achieve health goals along their weight loss journey   Vitamin D deficiency Assessment: Pt endorses being told she is Vitamin D deficient 2 yrs ago. Her Vitamin D deficiency is being treated with OTC Cholecalciferol 5,000 units daily. Pt also endorses taking a B complex Vitamin daily per her PCP.   Lab Results  Component Value Date   VD25OH 26.57 (L) 01/19/2023    Plan: Check labs. Pt advised to maintain with both supplements for now.   Weight loss will likely improve availability of vitamin D, thus encouraged Sarah Travis to continue with meal plan and their weight loss efforts to further improve this condition.  Thus, we will begin to monitor levels regularly (every 3-4 mo on average) to keep levels within normal limits and prevent over supplementation.   Other disorder of eating - Emotional Eating Assessment: Denies any SI/HI. Mood is stable. Pt endorses eating when stressed, bored, and to help comfort her self.   Plan: Start her prudent nutritional plan that is low in simple carbohydrates, saturated fats and trans fats to goal of 5-10% weight loss to achieve significant health benefits.  Pt encouraged to continually advance exercise and cardiovascular fitness as tolerated throughout weight loss journey. Will observe if she needs further counseling in future.    Anemia, unspecified type Assessment: Pt endorses having a hx of anemia after her first two pregnancies and being on prescription Iron supplements. She is currently not taking any Iron supplement or any other medications for anemia.   Plan: Recheck labs   Start her prudent nutritional plan that is low in simple carbohydrates, saturated fats and trans fats to goal of 5-10% weight loss to achieve significant health benefits.  Pt encouraged to continually advance exercise and cardiovascular fitness as tolerated throughout weight loss journey. Will begin to  monitor condition as deemed clinically necessary.    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 She endorses never being on thyroid medication.   She also had thyroid uptake and scan which was normal at 12% uptake but consistent with multinodular goiter, and a left dominant cold nodule. Left  Cold nodule was biopsied in 2015 with benign cytology. She is also S/P benign FNA of the left inferior nodule in 2015. She is S/P benign FNA of the left mid and right mid nodule 02/2022.   Plan: Pt endorses that she may have a thyroidectomy in the upcoming months. Continue regular follow up with Dr. Lonzo Cloud as directed. Will follow with Endocrinology.    TREATMENT PLAN FOR OBESITY: BMI 40.0-44.9, adult (HCC)- starting bmi 44.2 02/11/23 Morbid obesity (HCC) Assessment: Condition is not optimized. Since information session visit on 01/21/23, Muscle mass has decreased by 2.4 lb. Fat mass has increased by 4 lb.Total body water has increased by 0.2 lb.   Plan: Begin the Category 1 meal plan.   Behavioral Intervention Additional resources provided today: category 1 meal plan information Evidence-based interventions for health behavior change were utilized today  including the discussion of self monitoring techniques, problem-solving barriers and SMART goal setting techniques.   Regarding patient's less desirable eating habits and patterns, we employed the technique of small changes.  Pt will specifically work on: begin prescribed meal plan for next visit.    Recommended Physical Activity Goals Sarah Travis has been advised to gradually work up to 150 minutes of moderate intensity aerobic activity a week and strengthening exercises 2-3 times per week for cardiovascular health, weight loss maintenance and preservation of muscle mass.  She has agreed to continue their current level of activity  FOLLOW UP: Follow up in 2 weeks. She  was informed of the importance of frequent follow up visits to maximize her success with intensive lifestyle modifications for her multiple health conditions.  Sarah Travis is aware that we will review all of her lab results at our next visit.  She is aware that if anything is critical/ life threatening with the results, we will be contacting her via MyChart prior to the office visit to discuss management.    Chief Complaint:   OBESITY Sarah Travis (MR# [id number]) is a 52 y.o. female who presents for evaluation and treatment of obesity and related comorbidities. Current BMI is Body mass index is 43.41 kg/m. Sarah Travis has been struggling with her weight for many years and has been unsuccessful in either losing weight, maintaining weight loss, or reaching her healthy weight goal.  Sarah Travis is currently in the action stage of change and ready to dedicate time achieving and maintaining a healthier weight. Sarah Travis is interested in becoming our patient and working on intensive lifestyle modifications including (but not limited to) diet and exercise for weight loss.  Sarah Travis is a stay at home. Patient is married to [employer]   and has 3 children. She lives with her husband and 2 daughters (58 y.o and 1 y.o).  Sarah Travis's habits were reviewed today and are as follows:   - Desires to be 140 lbs (college weight) in 1 year.   - Attributes her weight gain to: "eating more food to breast feed my child"   - Walks 30 minutes, 3 days a wk.   - She has tried Keto, low carb, and Intermittent fasting in the past. Intermittent fasting worked best for her because of Ramadan.   - She does not eat outside the home.   - She craves these foods at night: breads, cookies, and pizza.   - Snacks on cookies.   - Skips lunch most of the time.  - Worst food habit: over eating.   Subjective:   This is the patient's first visit at [facility].  The  patient's NEW PATIENT PACKET that they filled out prior to today's office visit was reviewed at length and information from that paperwork was included within the following office visit note.    Included in the packet: current and past health history, medications, allergies, ROS, gynecologic history (women only), surgical history, family history, social history, weight history, weight loss surgery history (for those that have had weight loss surgery), nutritional evaluation, mood and food questionnaire along with a depression screening (PHQ9) on all patients, an Epworth questionnaire, sleep habits questionnaire, patient life and health improvement goals questionnaire. These will all be scanned into the patient's chart under the "media" tab.   Review of Systems: Please refer to new patient packet scanned into media. Pertinent positives were addressed with patient today.  Reviewed by clinician on day  of visit: allergies, medications, problem list, medical history, surgical history, family history, social history, and previous encounter notes.  During the visit, I independently reviewed the patient's EKG, bioimpedance scale results, and indirect calorimeter results. I used this information to tailor a meal plan for the patient that will help Sarah Travis to lose weight and will improve her obesity-related conditions going forward.  I performed a medically necessary appropriate examination and/or evaluation. I discussed the assessment and treatment plan with the patient. The patient was provided an opportunity to ask questions and all were answered. The patient agreed with the plan and demonstrated an understanding of the instructions. Labs were ordered today (unless patient declined them) and will be reviewed with the patient at our next visit unless more critical results need to be addressed immediately. Clinical information was updated and documented in the EMR.  Time spent on visit including  pre-visit chart review and post-visit care was estimated to be 50  minutes. Over 50% of the time was spent in direct face to face counseling and coordination of care. Objective:   PHYSICAL EXAM: Blood pressure 132/82, pulse 80, temperature 98.4 F (36.9 C), height 5' 0.5" (1.537 m), weight 226 lb (102.5 kg), SpO2 96 %. Body mass index is 43.41 kg/m. General: Well Developed, well nourished, and in no acute distress.  HEENT: Normocephalic, atraumatic Skin: Warm and dry, cap RF less 2 sec, good turgor Chest:  Normal excursion, shape, no gross abn Respiratory: speaking in full sentences, no conversational dyspnea NeuroM-Sk: Ambulates w/o assistance, moves * 4 Psych: A and O *3, insight good, mood-full  Anthropometric Measurements Height: 5' 0.5" (1.537 m) Weight: 226 lb (102.5 kg) BMI (Calculated): 43.39 Weight at Last Visit: n/a Weight Lost Since Last Visit: n/a Weight Gained Since Last Visit: n/a Starting Weight: 237lb Peak Weight: 237lb Waist Measurement : 43 inches   Body Composition  Body Fat %: 51.2 % Fat Mass (lbs): 116 lbs Muscle Mass (lbs): 104.8 lbs Total Body Water (lbs): 77.4 lbs Visceral Fat Rating : 17   Other Clinical Data RMR: [id number] Fasting: yes Labs: yes Today's Visit #: 1 Starting Date: 02/11/23   DIAGNOSTIC DATA REVIEWED:  BMET    Component Value Date/Time   NA 141 01/19/2023 1412   K 4.9 01/19/2023 1412   CL 102 01/19/2023 1412   CO2 31 01/19/2023 1412   GLUCOSE 103 (H) 01/19/2023 1412   BUN 15 01/19/2023 1412   CREATININE 0.46 01/19/2023 1412   CALCIUM 9.8 01/19/2023 1412   Lab Results  Component Value Date   HGBA1C 6.0 (H) 10/18/2010   No results found for: "INSULIN" Lab Results  Component Value Date   TSH 0.36 01/19/2023   CBC    Component Value Date/Time   WBC 6.0 01/19/2023 1412   RBC 4.46 01/19/2023 1412   HGB 12.3 01/19/2023 1412   HCT 38.9 01/19/2023 1412   PLT 339.0 01/19/2023 1412   MCV 87.2 01/19/2023 1412   MCHC  31.7 01/19/2023 1412   RDW 13.4 01/19/2023 1412   Iron Studies    Component Value Date/Time   FERRITIN 81 10/18/2010 2026   Lipid Panel  No results found for: "CHOL", "TRIG", "HDL", "CHOLHDL", "VLDL", "LDLCALC", "LDLDIRECT" Hepatic Function Panel     Component Value Date/Time   PROT 7.2 01/19/2023 1412   ALBUMIN 4.1 01/19/2023 1412   AST 19 01/19/2023 1412   ALT 15 01/19/2023 1412   ALKPHOS 86 01/19/2023 1412   BILITOT 0.2 01/19/2023 1412  Component Value Date/Time   TSH 0.36 01/19/2023 1412   Nutritional Lab Results  Component Value Date   VD25OH 26.57 (L) 01/19/2023    Attestation Statements:   I, Sarah Travis, acting as a Stage manager for [employer], DO., have compiled all relevant documentation for today's office visit on behalf of Thomasene Lot, DO, while in the presence of [employer], DO.  Reviewed by clinician on day of visit: allergies, medications, problem list, medical history, surgical history, family history, social history, and previous encounter notes pertinent to patient's obesity diagnosis.    This encounter took 55 total minutes of time including time coordinating the above treatment plan, any pre-visit chart review and post-visit documentation and reviewing any labs and/or imaging, reviewing pertinent prior notes, and providing counseling the patient about such treatment plan.  Approximately 50% of this time was spent in direct, face-to-face counseling and coordination of care.   I have reviewed the above documentation for accuracy and completeness, and I agree with the above. Carlye Grippe, D.O.  The 21st Century Cures Act was signed into law in 2016 which includes the topic of electronic health records.  This provides immediate access to information in MyChart.  This includes consultation notes, operative notes, office notes, lab results and pathology reports.  If you have any questions about what you read please let us know at your  next visit so we can discuss your concerns and take corrective action if need be.  We are right here with you.

## 2023-02-12 LAB — LIPID PANEL WITH LDL/HDL RATIO
Cholesterol, Total: 274 mg/dL — ABNORMAL HIGH (ref 100–199)
HDL: 69 mg/dL (ref >39)
LDL Chol Calc (NIH): 191 mg/dL — ABNORMAL HIGH (ref 0–99)
LDL/HDL Ratio: 2.8 ratio (ref 0.0–3.2)
Triglycerides: 84 mg/dL (ref 0–149)
VLDL Cholesterol Cal: 14 mg/dL (ref 5–40)

## 2023-02-12 LAB — FOLATE: Folate: 15.4 ng/mL (ref >3.0)

## 2023-02-12 LAB — INSULIN, RANDOM: INSULIN: 8.3 u[IU]/mL (ref 2.6–24.9)

## 2023-02-12 LAB — HEMOGLOBIN A1C
Est. average glucose Bld gHb Est-mCnc: 131 mg/dL
Hgb A1c MFr Bld: 6.2 % — ABNORMAL HIGH (ref 4.8–5.6)

## 2023-03-03 ENCOUNTER — Ambulatory Visit (INDEPENDENT_AMBULATORY_CARE_PROVIDER_SITE_OTHER): Payer: 59 | Admitting: Family Medicine

## 2023-03-03 ENCOUNTER — Encounter (INDEPENDENT_AMBULATORY_CARE_PROVIDER_SITE_OTHER): Payer: Self-pay | Admitting: Family Medicine

## 2023-03-03 VITALS — BP 119/86 | HR 78 | Temp 98.1°F | Ht 60.5 in | Wt 219.0 lb

## 2023-03-03 DIAGNOSIS — R7303 Prediabetes: Secondary | ICD-10-CM | POA: Diagnosis not present

## 2023-03-03 DIAGNOSIS — E7849 Other hyperlipidemia: Secondary | ICD-10-CM | POA: Diagnosis not present

## 2023-03-03 DIAGNOSIS — E559 Vitamin D deficiency, unspecified: Secondary | ICD-10-CM | POA: Insufficient documentation

## 2023-03-03 DIAGNOSIS — Z6841 Body Mass Index (BMI) 40.0 and over, adult: Secondary | ICD-10-CM | POA: Diagnosis not present

## 2023-03-03 DIAGNOSIS — E65 Localized adiposity: Secondary | ICD-10-CM

## 2023-03-03 NOTE — Patient Instructions (Signed)
The 10-year ASCVD risk score (Arnett DK, et al., 2019) is: 1.6%   Values used to calculate the score:     Age: 52 years     Sex: Female     Is Non-Hispanic African American: No     Diabetic: No     Tobacco smoker: No     Systolic Blood Pressure: 119 mmHg     Is BP treated: No     HDL Cholesterol: 69 mg/dL     Total Cholesterol: 274 mg/dL

## 2023-03-03 NOTE — Progress Notes (Signed)
Sarah Travis, D.O. ABFM, ABOM Clinical Bariatric Medicine Physician  Office located at: [address] [place], [place]  [address]     Assessment and Plan:   Prediabetes Assessment: Condition is new and not optimized. Notes sometimes having some cravings after dinner. Labs below from our clinic and PCP indicate that: Her A1c levels are consistent with the prediabetic range of 5.7-6.4. Her insulin levels are 1.66 times normal. Folate levels are stable. Kidney and liver function appear to be stable, however sodium levels are suggestive of slight dehydration. Labs were reviewed with patient today and education provided on them. We discussed how the foods patient eats may influence these laboratory findings.  All of the patient's questions about them were answered    Lab Results  Component Value Date   HGBA1C 6.2 (H) 02/11/2023   HGBA1C 6.0 (H) 10/18/2010   INSULIN 8.3 02/11/2023   Lab Results  Component Value Date   CREATININE 0.46 01/19/2023   BUN 15 01/19/2023   NA 141 01/19/2023   K 4.9 01/19/2023   CL 102 01/19/2023   CO2 31 01/19/2023      Component Value Date/Time   PROT 7.2 01/19/2023 1412   ALBUMIN 4.1 01/19/2023 1412   AST 19 01/19/2023 1412   ALT 15 01/19/2023 1412   ALKPHOS 86 01/19/2023 1412   BILITOT 0.2 01/19/2023 1412     Plan: Unless pre-existing renal or cardiopulmonary conditions exist which patient was told to limit their fluid intake by another provider, I recommended roughly one half of their weight in pounds, to be the approximate ounces of non-caloric, non-caffeinated beverages they should drink per day; including more if they are engaging in exercise (one extra bottle for every 30 minutes of exercise)   Reminded [name] that lifestyle changes are the first line treatment option for most all disease processes.  Education provided that "food is medicine" and I encouraged her to be mindful of how certain foods can improve or worsen her  medical conditions.     I counseled patient on pathophysiology of the disease process  Pre-DM.  Stressed importance of dietary and lifestyle modifications to result in weight loss as first line txmnt.  Explained role of simple carbs and insulin levels on hunger and cravings. Educated patient that having protein with each meal is important for stabilizing sugars and an important part of her prediabetes management as well as controlling hunger and cravings. Prediabetes and I.R Handout provided at pt's request after education provided- also reccommended her to read more about prediabetes on the ADA website.  All concerns/questions addressed. Shatasha will continue to work on weight loss, exercise, via their meal plan we devised to help decrease the risk of progressing to diabetes. We will recheck A1c and fasting insulin level in approximately 3 months from last check, or as deemed appropriate.     Other hyperlipidemia Assessment: Condition is diet/exercise controlled. Labs below indicate that: Her Cholesterol and LDL levels are elevated at 274 and 191 respectively. Cholesterol goal: <200. LDL goal <100. TRIG levels are stable. Since her ASCVD score is 1.6%, I do not feel statin therapy is indicated at this time. Labs were reviewed with patient today and education provided on them. We discussed how the foods patient eats may influence these laboratory findings.  All of the patient's questions about them were answered    Lab Results  Component Value Date   CHOL 274 (H) 02/11/2023   HDL 69 02/11/2023   LDLCALC  191 (H) 02/11/2023   TRIG 84 02/11/2023   The 10-year ASCVD risk score (Arnett DK, et al., 2019) is: 1.6%   Values used to calculate the score:     Age: 52 years     Sex: Female     Is Non-Hispanic African American: No     Diabetic: No     Tobacco smoker: No     Systolic Blood Pressure: 119 mmHg     Is BP treated: No     HDL Cholesterol: 69 mg/dL     Total Cholesterol: 274 mg/dL   Plan:  [name] agrees to continue our treatment plan of a heart-heathy, low cholesterol meal plan. Cardiovascular risk and specific lipid/LDL goals reviewed.  We discussed several lifestyle modifications today and she will continue to work on diet, exercise and weight loss efforts.  I stressed the importance that patient continue with our prudent nutritional plan that is low in saturated and trans fats, and low in fatty carbs to improve these numbers. We recommend: aerobic activity with eventual goal of a minimum of 150+ min wk plus 2 days/ week of resistance or strength training.  We will continue routine screening as patient continues to achieve health goals along their weight loss journey    Visceral obesity Assessment: Current visceral fat rating: 16.  The visceral fat rating should be < 12 in a female.  Plan:Visceral adipose tissue is a hormonally active component of total body fat. This body composition phenotype is associated with medical disorders such as metabolic syndrome, cardiovascular disease and several malignancies including prostate, breast, and colorectal cancers. -Goal: Lose 7-10% of weight via prudent nutritional plan and lifestyle changes.     TREATMENT PLAN FOR OBESITY: BMI 40.0-44.9, adult (HCC)- starting bmi 44.2 02/11/23 Morbid obesity (HCC)- current BMI 42.05 Assessment: LUCERITO ROSINSKI is here to discuss her progress with her obesity treatment plan along with follow-up of her obesity related diagnoses. See Medical Weight Management Flowsheet for complete bioelectrical impedance results.  Condition is improving. Biometric data collected today, was reviewed with patient.   Since last office visit on 02/11/23 patient's Muscle mass has increased by 1 lb. Fat mass has decreased by 8 lb. Total body water has decreased by 2 lb.  Counseling done on how various foods will affect these numbers and how to maximize success  Total lbs lost to date: 7 lbs Total weight loss  percentage to date: 3.10  Plan: Continue the Category 1 meal plan. I encouraged Michel to measure her fruit intake to avoid excess sugar and calories. I showed Jasman the calories differences between 1 cup of varying fruits (pineapple, bananas etc..). I also educated pt that nuts are calorie dense foods and to measure her intake of them carefully and subtract from her snack calories.  Behavioral Intervention Additional resources provided today:  Cookie Recipe Handout Evidence-based interventions for health behavior change were utilized today including the discussion of self monitoring techniques, problem-solving barriers and SMART goal setting techniques.   Regarding patient's less desirable eating habits and patterns, we employed the technique of small changes.  Pt will specifically work on: continuing with prescribed meal plan/exercise regiment and measuring fruit intake for next visit.    Recommended Physical Activity Goals  Evett has been advised to slowly work up to 150 minutes of moderate intensity aerobic activity a week and strengthening exercises 2-3 times per week for cardiovascular health, weight loss maintenance and preservation of muscle mass.   She has agreed to [employer]  current level of physical activity   FOLLOW UP: Return in about 2 weeks (around 03/17/2023). She was informed of the importance of frequent follow up visits to maximize her success with intensive lifestyle modifications for her multiple health conditions.  Subjective:   Chief complaint: Obesity Jessicalynn is here to discuss her progress with her obesity treatment plan. She is on the Category 1 Plan and states she is following her eating plan approximately 90% of the time. She states she is walking 60 minutes 5-7 days per week.  Interval History:  STEPHANEY STEVEN is here today for her first follow-up office visit since starting the program with [place].  Since last OV, Maurya has been doing well with the meal plan.  Reports being able to eat all the protein on the meal plan without difficulty- typically eats tuna, salmon, chicken breast, and [place]. Endorses eating pineapples for snacks, but is not measuring pineapple intake. Reports sometimes having some cravings after dinner. Before bed, she drinks black tea with caffeine + Fair life milk and a small homemade sugary cookie with unknown amount of calories -reports doing this routine for 20+ yrs.   All blood work/ lab tests that were recently ordered by myself or an outside provider were reviewed with patient today per their request. Extended time was spent counseling her on all new disease processes that were discovered or preexisting ones that are affected by BMI.  she understands that many of these abnormalities will need to monitored regularly along with the current treatment plan of prudent dietary changes, in which we are making each and every office visit, to improve these health parameters.  Review of Systems:  Pertinent positives were addressed with patient today.  Weight Summary and Biometrics   Weight Lost Since Last Visit: 7lb  Weight Gained Since Last Visit: 0lb   Vitals Temp: 98.1 F (36.7 C) BP: 119/86 Pulse Rate: 78 SpO2: 99 %   Anthropometric Measurements Height: 5' 0.5" (1.537 m) Weight: 219 lb (99.3 kg) BMI (Calculated): 42.05 Weight at Last Visit: 226lb Weight Lost Since Last Visit: 7lb Weight Gained Since Last Visit: 0lb Starting Weight: 226lb Total Weight Loss (lbs): 7 lb (3.175 kg) Peak Weight: 237lb   Body Composition  Body Fat %: 49.2 % Fat Mass (lbs): 108 lbs Muscle Mass (lbs): 105.8 lbs Total Body Water (lbs): 75.4 lbs Visceral Fat Rating : 16   Other Clinical Data Fasting: no Labs: no Today's Visit #: 2 Starting Date: 02/11/23   Objective:   PHYSICAL EXAM:  Blood pressure 119/86, pulse 78, temperature 98.1 F (36.7 C), height 5' 0.5" (1.537 m), weight 219 lb (99.3 kg), SpO2 99%. Body mass index  is 42.07 kg/m.  General: Well Developed, well nourished, and in no acute distress.  HEENT: Normocephalic, atraumatic Skin: Warm and dry, cap RF less 2 sec, good turgor Chest:  Normal excursion, shape, no gross abn Respiratory: speaking in full sentences, no conversational dyspnea NeuroM-Sk: Ambulates w/o assistance, moves * 4 Psych: A and O *3, insight good, mood-full  DIAGNOSTIC DATA REVIEWED:  BMET    Component Value Date/Time   NA 141 01/19/2023 1412   K 4.9 01/19/2023 1412   CL 102 01/19/2023 1412   CO2 31 01/19/2023 1412   GLUCOSE 103 (H) 01/19/2023 1412   BUN 15 01/19/2023 1412   CREATININE 0.46 01/19/2023 1412   CALCIUM 9.8 01/19/2023 1412   Lab Results  Component Value Date   HGBA1C 6.2 (H) 02/11/2023   HGBA1C 6.0 (H) 10/18/2010  Lab Results  Component Value Date   INSULIN 8.3 02/11/2023   Lab Results  Component Value Date   TSH 0.36 01/19/2023   CBC    Component Value Date/Time   WBC 6.0 01/19/2023 1412   RBC 4.46 01/19/2023 1412   HGB 12.3 01/19/2023 1412   HCT 38.9 01/19/2023 1412   PLT 339.0 01/19/2023 1412   MCV 87.2 01/19/2023 1412   MCHC 31.7 01/19/2023 1412   RDW 13.4 01/19/2023 1412   Iron Studies    Component Value Date/Time   FERRITIN 81 10/18/2010 2026   Lipid Panel     Component Value Date/Time   CHOL 274 (H) 02/11/2023 1017   TRIG 84 02/11/2023 1017   HDL 69 02/11/2023 1017   LDLCALC 191 (H) 02/11/2023 1017   Hepatic Function Panel     Component Value Date/Time   PROT 7.2 01/19/2023 1412   ALBUMIN 4.1 01/19/2023 1412   AST 19 01/19/2023 1412   ALT 15 01/19/2023 1412   ALKPHOS 86 01/19/2023 1412   BILITOT 0.2 01/19/2023 1412      Component Value Date/Time   TSH 0.36 01/19/2023 1412   Nutritional Lab Results  Component Value Date   VD25OH 26.57 (L) 01/19/2023    Attestations:   Reviewed by clinician on day of visit: allergies, medications, problem list, medical history, surgical history, family history, social  history, and previous encounter notes.   Patient was in the office today and time spent on visit including pre-visit chart review and post-visit care/coordination of care and electronic medical record documentation was 60 minutes. 50% of the time was in face to face counseling of this patient's medical condition(s) and providing education on treatment options to include the first-line treatment of diet and lifestyle modification.  I, Special Randolm Idol , acting as a Stage manager for [employer], DO., have compiled all relevant documentation for today's office visit on behalf of Thomasene Lot, DO, while in the presence of [employer], DO.  I have reviewed the above documentation for accuracy and completeness, and I agree with the above. Sarah Travis, D.O.  The 21st Century Cures Act was signed into law in 2016 which includes the topic of electronic health records.  This provides immediate access to information in MyChart.  This includes consultation notes, operative notes, office notes, lab results and pathology reports.  If you have any questions about what you read please let us know at your next visit so we can discuss your concerns and take corrective action if need be.  We are right here with you.

## 2023-03-10 IMAGING — MG MM DIGITAL SCREENING BILAT W/ TOMO AND CAD
8 series · 8 of 24 positions shown · non-contrast
Comparison: Previous exam(s).

ACR Breast Density Category a: The breast tissue is almost entirely
fatty.

CLINICAL DATA: Screening.

EXAM:
DIGITAL SCREENING BILATERAL MAMMOGRAM WITH TOMOSYNTHESIS AND CAD
TECHNIQUE: Bilateral screening digital craniocaudal and mediolateral oblique
mammograms were obtained. Bilateral screening digital breast
tomosynthesis was performed. The images were evaluated with
computer-aided detection.

[L MLO synth-2D]
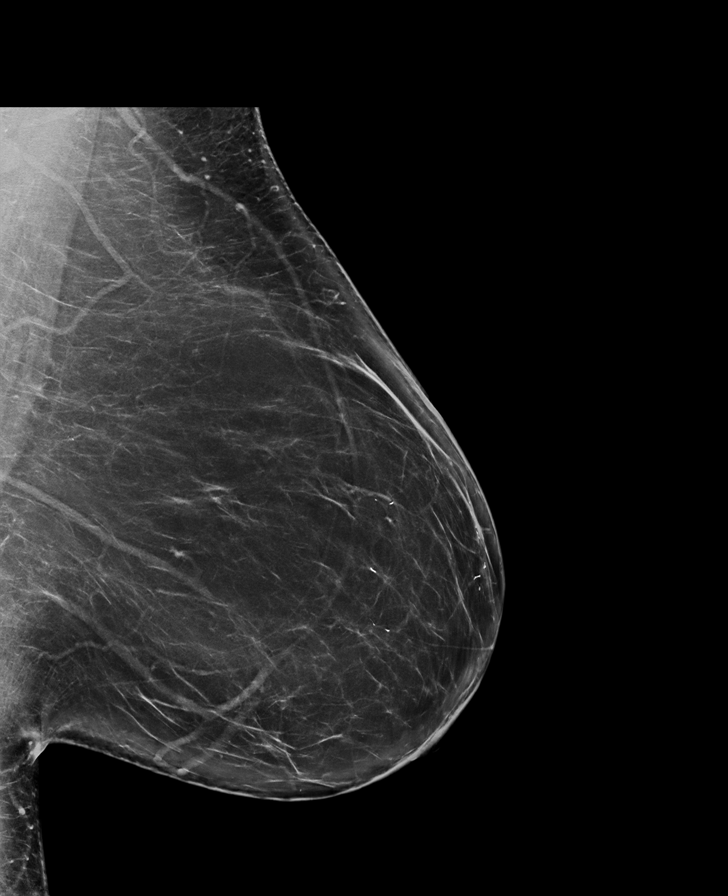

[R CC synth-2D]
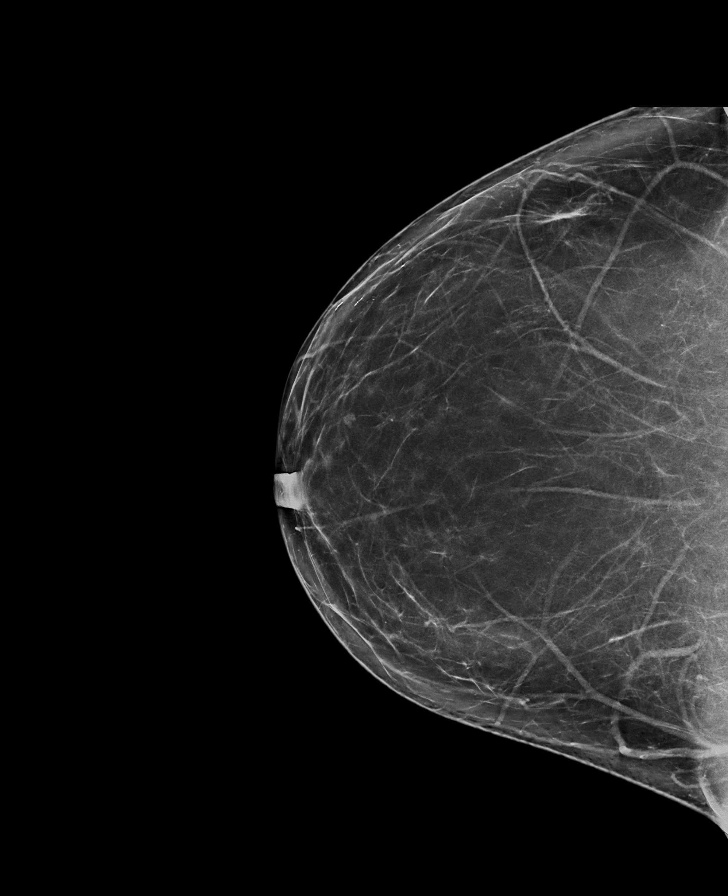

[R MLO synth-2D]
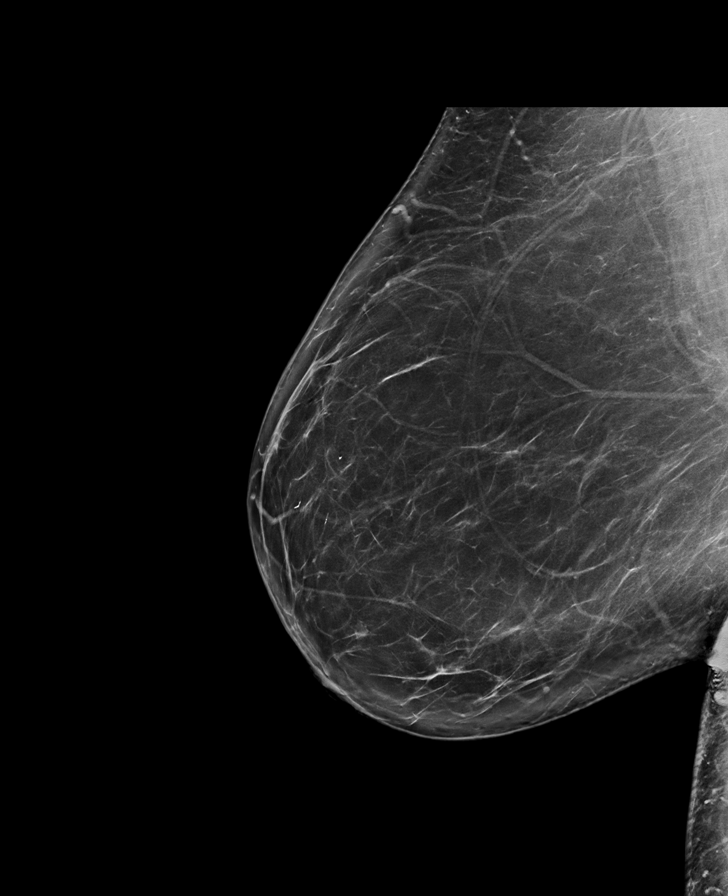

[L CC synth-2D]
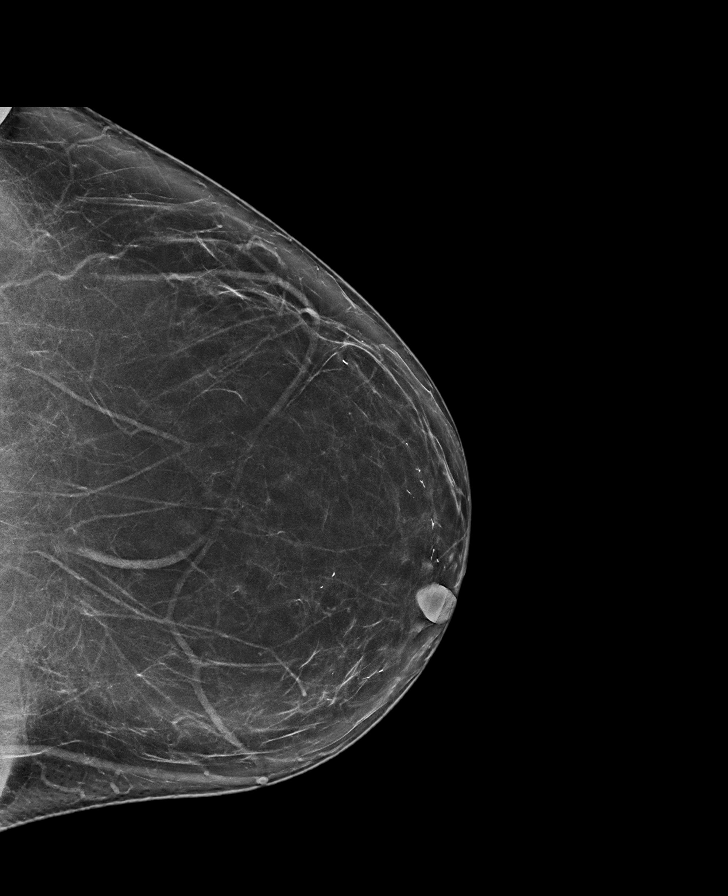

[L MLO tomo · tomo slice 47/92.0]
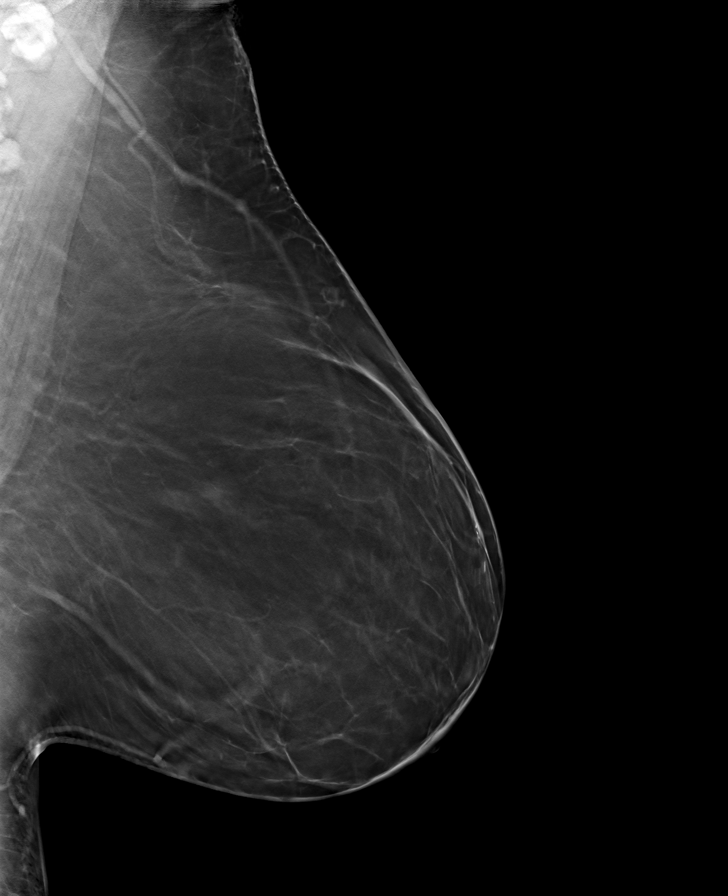

[L CC tomo · tomo slice 39/76.0]
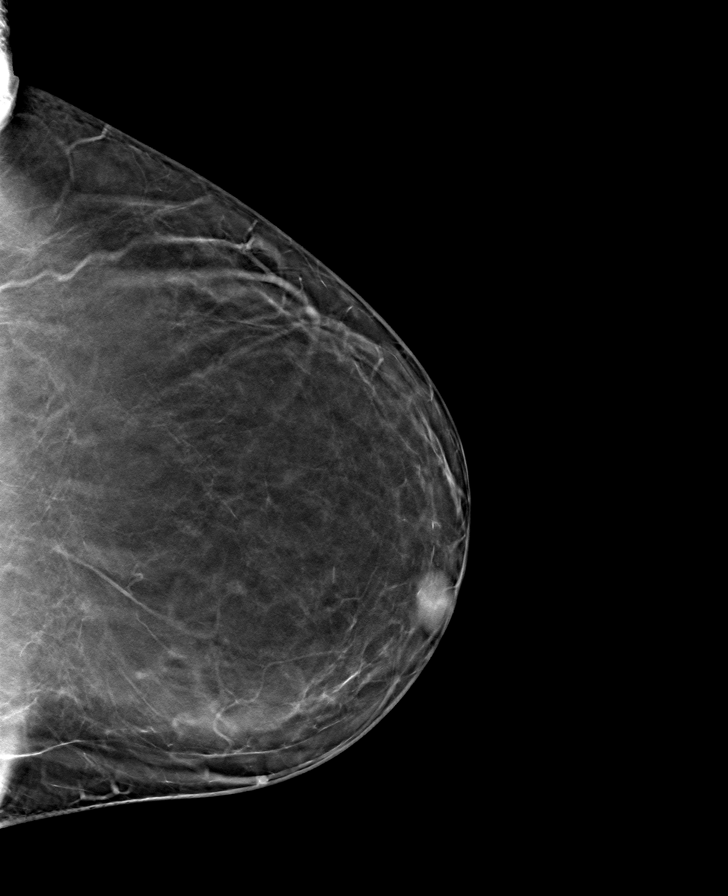

[R MLO tomo · tomo slice 45/90.0]
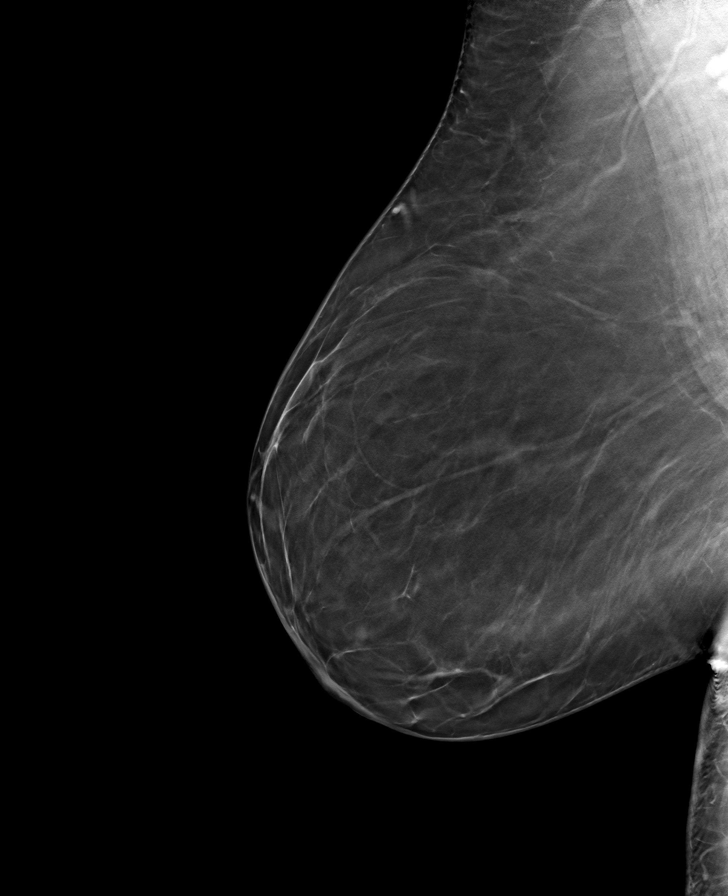

[R CC tomo · tomo slice 39/77.0]
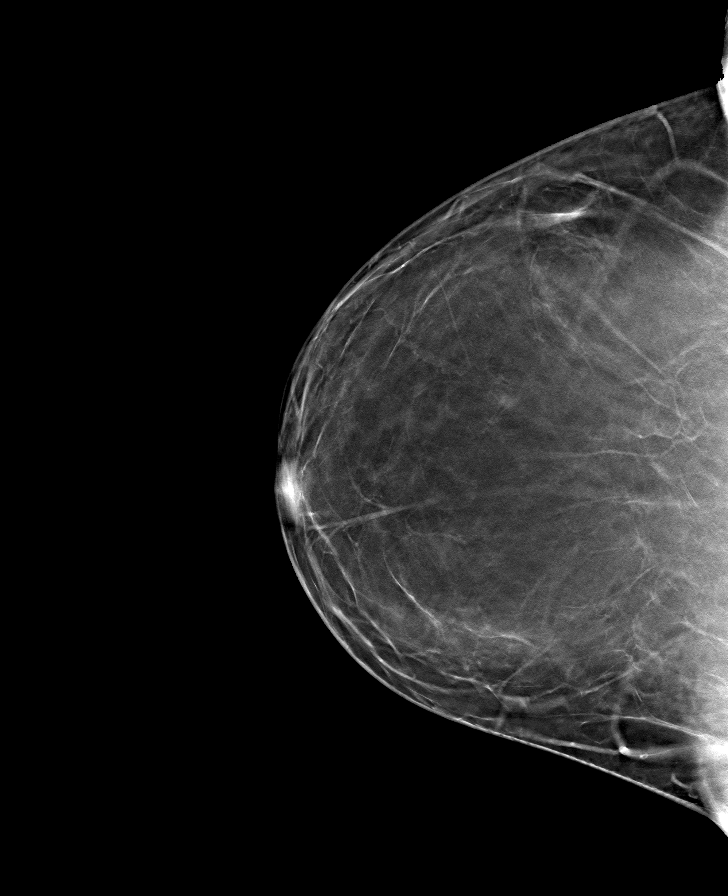

[8 of 24 positions shown; findings below may reference images not displayed]

FINDINGS: There are no findings suspicious for malignancy.
IMPRESSION: No mammographic evidence of malignancy. A result letter of this
screening mammogram will be mailed directly to the patient.

RECOMMENDATION:
Screening mammogram in one year. (Code:0E-3-N98)

BI-RADS CATEGORY  1: Negative.

## 2023-03-17 ENCOUNTER — Encounter (INDEPENDENT_AMBULATORY_CARE_PROVIDER_SITE_OTHER): Payer: Self-pay | Admitting: Family Medicine

## 2023-03-17 ENCOUNTER — Ambulatory Visit (INDEPENDENT_AMBULATORY_CARE_PROVIDER_SITE_OTHER): Payer: 59 | Admitting: Family Medicine

## 2023-03-17 VITALS — BP 117/84 | HR 74 | Temp 98.3°F | Ht 60.5 in | Wt 217.0 lb

## 2023-03-17 DIAGNOSIS — E559 Vitamin D deficiency, unspecified: Secondary | ICD-10-CM | POA: Diagnosis not present

## 2023-03-17 DIAGNOSIS — E538 Deficiency of other specified B group vitamins: Secondary | ICD-10-CM

## 2023-03-17 DIAGNOSIS — R7303 Prediabetes: Secondary | ICD-10-CM

## 2023-03-17 DIAGNOSIS — Z6841 Body Mass Index (BMI) 40.0 and over, adult: Secondary | ICD-10-CM | POA: Diagnosis not present

## 2023-03-17 NOTE — Progress Notes (Signed)
Sarah Travis, D.O.  ABFM, ABOM Specializing in Clinical Bariatric Medicine  Office located at: [address] [place], [place]  [address]     Assessment and Plan:   Prediabetes Assessment & Plan: Condition is currently diet/exercise controlled. Pt endorses  having less cravings after dinner since last OV; she subsides her cravings with fruit.   Lab Results  Component Value Date   HGBA1C 6.2 (H) 02/11/2023   HGBA1C 6.0 (H) 10/18/2010   INSULIN 8.3 02/11/2023    Reminded pt that having protein with each meal is important for stabilizing sugars and an important part of her prediabetes management as well as controlling hunger and cravings.    In addition, we discussed the risks and benefits of Metformin which can help [place] in the management of this disease process as well as with weight loss. Metformin handout also provided to pt.  Will consider starting Metformin in the future as we will focus on prudent nutritional plan at this time. Veneda will continue to work on weight loss, exercise, via their meal plan we devised to help decrease the risk of progressing to diabetes.    Vitamin D deficiency Assessment & Plan: Pt has been taking OTC Vitamin D 5,000 Units daily per ENDO after labs on 01/19/23 showed sub-optimal Vitamin D levels.   Lab Results  Component Value Date   VD25OH 26.57 (L) 01/19/2023   Continue with weight loss efforts and OTC Vitamin D at current dose to improve this condition. Recheck levels around early September.    B12 deficiency Assessment & Plan: Pt started B complex vitamin daily per Endocrinologist after labs on 01/19/23 showed that her B12 levels were low.   Lab Results  Component Value Date   VITAMINB12 304 01/19/2023   Continue their B complex and focus on b12 rich foods such as lean red meats; poultry; eggs; seafood; beans, peas, and lentils; nuts and seeds; and soy product. Recheck levels around early September.    TREATMENT PLAN FOR  OBESITY: BMI 40.0-44.9, adult (HCC)- current BMI 41.67 Morbid obesity (HCC)- 42.05- starting BMI 02/11/23 44.2 Assessment & Plan:  Sarah Travis is here to discuss her progress with her obesity treatment plan along with follow-up of her obesity related diagnoses. See Medical Weight Management Flowsheet for complete bioelectrical impedance results.  Condition is not optimized. Biometric data collected today, was reviewed with patient.   Since last office visit on 03/03/23 patient's  Muscle mass has decreased by 1.8 lb. Fat mass has increased by 0.2 lb. Total body water has increased by 0.2 lb.  Counseling done on how various foods will affect these numbers and how to maximize success  Total lbs lost to date: 9 lbs  Total weight loss percentage to date: 3.98   Continue the Category 1 meal plan with additional breakfast options.  Behavioral Intervention Additional resources provided today:  Hidden Calories Handout  and Metformin Handout, Breakfast Options Handout.  Evidence-based interventions for health behavior change were utilized today including the discussion of self monitoring techniques, problem-solving barriers and SMART goal setting techniques.   Regarding patient's less desirable eating habits and patterns, we employed the technique of small changes.  Pt will specifically work on: increasing her lean protein intake for next visit.    Recommended Physical Activity Goals  Sarah Travis has been advised to slowly work up to 150 minutes of moderate intensity aerobic activity a week and strengthening exercises 2-3 times per week for cardiovascular health, weight loss maintenance and preservation  of muscle mass. She has agreed to Continue current level of physical activity   FOLLOW UP: Return in about 2 weeks (around 03/31/2023). She was informed of the importance of frequent follow up visits to maximize her success with intensive lifestyle modifications for her multiple health  conditions.  Subjective:   Chief complaint: Obesity [name] is here to discuss her progress with her obesity treatment plan. She is on the Category 1 Plan and states she is following her eating plan approximately 80% of the time. She states she is walking 60 minutes 7 days per week.  Interval History:  ALCIA Travis is here for a follow up office visit. Since last OV, Sarah Travis has been doing well. Reports eating outside the home 3-4 times. In those instances, she reports eating baked chicken or fish, trying to limit her intake of oily foods, and having salad with dressing. On the days she ate out, she endorse skipping lunch to save her calories. When eating at home, she reports consuming all the protein on her meal plan and measuring her fruit intake. She notes that her hunger and cravings have improved since last OV.   Review of Systems:  Pertinent positives were addressed with patient today  Reviewed by clinician on day of visit: allergies, medications, problem list, medical history, surgical history, family history, social history, and previous encounter notes.  Weight Summary and Biometrics   Weight Lost Since Last Visit: 2lb  Weight Gained Since Last Visit: 0lb   Vitals Temp: 98.3 F (36.8 C) BP: 117/84 Pulse Rate: 74 SpO2: 97 %   Anthropometric Measurements Height: 5' 0.5" (1.537 m) Weight: 217 lb (98.4 kg) BMI (Calculated): 41.67 Weight at Last Visit: 219lb Weight Lost Since Last Visit: 2lb Weight Gained Since Last Visit: 0lb Starting Weight: 226lb Total Weight Loss (lbs): 9 lb (4.082 kg) Peak Weight: 237lb   Body Composition  Body Fat %: 49.7 % Fat Mass (lbs): 108.2 lbs Muscle Mass (lbs): 104 lbs Total Body Water (lbs): 75.6 lbs Visceral Fat Rating : 16   Other Clinical Data Fasting: no Labs: no Today's Visit #: 3 Starting Date: 02/11/23   Objective:   PHYSICAL EXAM: Blood pressure 117/84, pulse 74, temperature 98.3 F (36.8 C), height 5' 0.5"  (1.537 m), weight 217 lb (98.4 kg), SpO2 97%. Body mass index is 41.68 kg/m.  General: Well Developed, well nourished, and in no acute distress.  HEENT: Normocephalic, atraumatic Skin: Warm and dry, cap RF less 2 sec, good turgor Chest:  Normal excursion, shape, no gross abn Respiratory: speaking in full sentences, no conversational dyspnea NeuroM-Sk: Ambulates w/o assistance, moves * 4 Psych: A and O *3, insight good, mood-full  DIAGNOSTIC DATA REVIEWED:  BMET    Component Value Date/Time   NA 141 01/19/2023 1412   K 4.9 01/19/2023 1412   CL 102 01/19/2023 1412   CO2 31 01/19/2023 1412   GLUCOSE 103 (H) 01/19/2023 1412   BUN 15 01/19/2023 1412   CREATININE 0.46 01/19/2023 1412   CALCIUM 9.8 01/19/2023 1412   Lab Results  Component Value Date   HGBA1C 6.2 (H) 02/11/2023   HGBA1C 6.0 (H) 10/18/2010   Lab Results  Component Value Date   INSULIN 8.3 02/11/2023   Lab Results  Component Value Date   TSH 0.36 01/19/2023   CBC    Component Value Date/Time   WBC 6.0 01/19/2023 1412   RBC 4.46 01/19/2023 1412   HGB 12.3 01/19/2023 1412   HCT 38.9 01/19/2023 1412  PLT 339.0 01/19/2023 1412   MCV 87.2 01/19/2023 1412   MCHC 31.7 01/19/2023 1412   RDW 13.4 01/19/2023 1412   Iron Studies    Component Value Date/Time   FERRITIN 81 10/18/2010 2026   Lipid Panel     Component Value Date/Time   CHOL 274 (H) 02/11/2023 1017   TRIG 84 02/11/2023 1017   HDL 69 02/11/2023 1017   LDLCALC 191 (H) 02/11/2023 1017   Hepatic Function Panel     Component Value Date/Time   PROT 7.2 01/19/2023 1412   ALBUMIN 4.1 01/19/2023 1412   AST 19 01/19/2023 1412   ALT 15 01/19/2023 1412   ALKPHOS 86 01/19/2023 1412   BILITOT 0.2 01/19/2023 1412      Component Value Date/Time   TSH 0.36 01/19/2023 1412   Nutritional Lab Results  Component Value Date   VD25OH 26.57 (L) 01/19/2023    Attestations:   I, Special Puri , acting as a Stage manager for Thomasene Lot, DO.,  have compiled all relevant documentation for today's office visit on behalf of Thomasene Lot, DO, while in the presence of [employer], DO.  I have reviewed the above documentation for accuracy and completeness, and I agree with the above. Sarah Travis, D.O.  The 21st Century Cures Act was signed into law in 2016 which includes the topic of electronic health records.  This provides immediate access to information in MyChart.  This includes consultation notes, operative notes, office notes, lab results and pathology reports.  If you have any questions about what you read please let us know at your next visit so we can discuss your concerns and take corrective action if need be.  We are right here with you.

## 2023-03-31 ENCOUNTER — Ambulatory Visit (INDEPENDENT_AMBULATORY_CARE_PROVIDER_SITE_OTHER): Payer: 59 | Admitting: Family Medicine

## 2023-03-31 VITALS — BP 111/77 | HR 80 | Temp 98.6°F | Ht 60.5 in | Wt 214.0 lb

## 2023-03-31 DIAGNOSIS — Z6841 Body Mass Index (BMI) 40.0 and over, adult: Secondary | ICD-10-CM | POA: Diagnosis not present

## 2023-03-31 DIAGNOSIS — R7303 Prediabetes: Secondary | ICD-10-CM | POA: Diagnosis not present

## 2023-03-31 DIAGNOSIS — E559 Vitamin D deficiency, unspecified: Secondary | ICD-10-CM | POA: Diagnosis not present

## 2023-03-31 MED ORDER — METFORMIN HCL 500 MG PO TABS: ORAL_TABLET | ORAL | 0 refills | Status: DC

## 2023-03-31 NOTE — Progress Notes (Signed)
Carlye Grippe, D.O.  ABFM, ABOM Specializing in Clinical Bariatric Medicine  Office located at: [address] [place], [place]  [address]     Assessment and Plan:   Meds ordered this encounter  Medications   metFORMIN (GLUCOPHAGE) 500 MG tablet    Sig: 1 po with lunch daily    Dispense:  30 tablet    Refill:  0     Prediabetes Assessment & Plan: No current meds. Diet/exercise approach. Reports that hunger and cravings after dinner are a persistent issue.   Lab Results  Component Value Date   HGBA1C 6.2 (H) 02/11/2023   HGBA1C 6.0 (H) 10/18/2010   INSULIN 8.3 02/11/2023    Begin Metformin 500 mg daily- pt has been instructed to start with 1/2 tablet for 6 days and if tolerating well to increase to 1 full tablet. Discussed anticipated benefits, possible risks of starting Metformin. Explained that the more she eats "off-plan", the more likely for her to have side effects of the drug. Will continue to monitor condition.     Vitamin D deficiency Assessment & Plan: No issues with OTC Vitamin D 5,000 units daily, compliance good.   Lab Results  Component Value Date   VD25OH 26.57 (L) 01/19/2023   Continue with cholecalciferol at current dose- will recheck levels in ~ 1-2 months.   TREATMENT PLAN FOR OBESITY: BMI 40.0-44.9, adult (HCC)- current BMI 41.09 Morbid obesity (HCC)- 42.05- starting BMI 02/11/23 44.2 Assessment & Plan: Sarah Travis is here to discuss her progress with her obesity treatment plan along with follow-up of her obesity related diagnoses. See Medical Weight Management Flowsheet for complete bioelectrical impedance results.  Condition is improving.  Since last office visit on 03/17/23 patient's muscle mass has not changed. Fat mass has decreased by 3.2 lb. Total body water has increased by 1.6 lb.  Counseling done on how various foods will affect these numbers and how to maximize success  Total lbs lost to date: 12 lbs  Total weight  loss percentage to date: 5.21%   No change to meal plan - see Subjective   - I reviewed the Protein Content of Food Handout and provided her ideas of high protein meals with lentils like Beef Lentil Soup.  Behavioral Intervention Additional resources provided today:  Protein Content of Foods Handout Evidence-based interventions for health behavior change were utilized today including the discussion of self monitoring techniques, problem-solving barriers and SMART goal setting techniques.   Regarding patient's less desirable eating habits and patterns, we employed the technique of small changes. Pt will specifically work on: continue walking regiment and increasing her protein intake for next visit.    FOLLOW UP: Return in about 2 weeks (around 04/14/2023). She was informed of the importance of frequent follow up visits to maximize her success with intensive lifestyle modifications for her multiple health conditions.  Subjective:   Chief complaint: Obesity Sarah Travis is here to discuss her progress with her obesity treatment plan. She is on the Category 1 Plan with additional breakfast options and states she is following her eating plan approximately 90% of the time. She states she is walking 60 minutes 7 days per week.  Interval History:  Sarah Travis is here for a follow up office visit. Since last OV, Sarah Travis has been doing well. Has no complaints with the meal plan. Reports increasing her protein intake and has been having lentils as one of her protein sources. Endorses still having cravings and hunger after dinner.  She has a few friends who are interested in our program.  Review of Systems:  Pertinent positives were addressed with patient today.  Reviewed by clinician on day of visit: allergies, medications, problem list, medical history, surgical history, family history, social history, and previous encounter notes.  Weight Summary and Biometrics   Weight Lost Since Last Visit:  3lb  Weight Gained Since Last Visit: 0lb    Vitals Temp: 98.6 F (37 C) BP: 111/77 Pulse Rate: 80 SpO2: 98 %   Anthropometric Measurements Height: 5' 0.5" (1.537 m) Weight: 214 lb (97.1 kg) BMI (Calculated): 41.09 Weight at Last Visit: 217lb Weight Lost Since Last Visit: 3lb Weight Gained Since Last Visit: 0lb Starting Weight: 226lb Total Weight Loss (lbs): 12 lb (5.443 kg) Peak Weight: 237lb   Body Composition  Body Fat %: 48.9 % Fat Mass (lbs): 105 lbs Muscle Mass (lbs): 104 lbs Total Body Water (lbs): 76 lbs Visceral Fat Rating : 15   Other Clinical Data Fasting: no Labs: no Today's Visit #: 4 Starting Date: 02/11/23   Objective:   PHYSICAL EXAM: Blood pressure 111/77, pulse 80, temperature 98.6 F (37 C), height 5' 0.5" (1.537 m), weight 214 lb (97.1 kg), SpO2 98%. Body mass index is 41.11 kg/m.  General: Well Developed, well nourished, and in no acute distress.  HEENT: Normocephalic, atraumatic Skin: Warm and dry, cap RF less 2 sec, good turgor Chest:  Normal excursion, shape, no gross abn Respiratory: speaking in full sentences, no conversational dyspnea NeuroM-Sk: Ambulates w/o assistance, moves * 4 Psych: A and O *3, insight good, mood-full  DIAGNOSTIC DATA REVIEWED:  BMET    Component Value Date/Time   NA 141 01/19/2023 1412   K 4.9 01/19/2023 1412   CL 102 01/19/2023 1412   CO2 31 01/19/2023 1412   GLUCOSE 103 (H) 01/19/2023 1412   BUN 15 01/19/2023 1412   CREATININE 0.46 01/19/2023 1412   CALCIUM 9.8 01/19/2023 1412   Lab Results  Component Value Date   HGBA1C 6.2 (H) 02/11/2023   HGBA1C 6.0 (H) 10/18/2010   Lab Results  Component Value Date   INSULIN 8.3 02/11/2023   Lab Results  Component Value Date   TSH 0.36 01/19/2023   CBC    Component Value Date/Time   WBC 6.0 01/19/2023 1412   RBC 4.46 01/19/2023 1412   HGB 12.3 01/19/2023 1412   HCT 38.9 01/19/2023 1412   PLT 339.0 01/19/2023 1412   MCV 87.2 01/19/2023 1412    MCHC 31.7 01/19/2023 1412   RDW 13.4 01/19/2023 1412   Iron Studies    Component Value Date/Time   FERRITIN 81 10/18/2010 2026   Lipid Panel     Component Value Date/Time   CHOL 274 (H) 02/11/2023 1017   TRIG 84 02/11/2023 1017   HDL 69 02/11/2023 1017   LDLCALC 191 (H) 02/11/2023 1017   Hepatic Function Panel     Component Value Date/Time   PROT 7.2 01/19/2023 1412   ALBUMIN 4.1 01/19/2023 1412   AST 19 01/19/2023 1412   ALT 15 01/19/2023 1412   ALKPHOS 86 01/19/2023 1412   BILITOT 0.2 01/19/2023 1412      Component Value Date/Time   TSH 0.36 01/19/2023 1412   Nutritional Lab Results  Component Value Date   VD25OH 26.57 (L) 01/19/2023    Attestations:   I, Special Puri, acting as a Stage manager for Thomasene Lot, DO., have compiled all relevant documentation for today's office visit on behalf of Thomasene Lot, DO,  while in the presence of [employer], DO.  I have reviewed the above documentation for accuracy and completeness, and I agree with the above. Carlye Grippe, D.O.  The 21st Century Cures Act was signed into law in 2016 which includes the topic of electronic health records.  This provides immediate access to information in MyChart.  This includes consultation notes, operative notes, office notes, lab results and pathology reports.  If you have any questions about what you read please let us know at your next visit so we can discuss your concerns and take corrective action if need be.  We are right here with you.

## 2023-04-14 ENCOUNTER — Encounter (INDEPENDENT_AMBULATORY_CARE_PROVIDER_SITE_OTHER): Payer: Self-pay | Admitting: Physician Assistant

## 2023-04-14 ENCOUNTER — Ambulatory Visit (INDEPENDENT_AMBULATORY_CARE_PROVIDER_SITE_OTHER): Payer: 59 | Admitting: Physician Assistant

## 2023-04-14 VITALS — BP 103/71 | HR 81 | Temp 98.3°F | Ht 60.5 in | Wt 211.0 lb

## 2023-04-14 DIAGNOSIS — Z72820 Sleep deprivation: Secondary | ICD-10-CM

## 2023-04-14 DIAGNOSIS — Z6841 Body Mass Index (BMI) 40.0 and over, adult: Secondary | ICD-10-CM

## 2023-04-14 DIAGNOSIS — R7303 Prediabetes: Secondary | ICD-10-CM

## 2023-04-14 DIAGNOSIS — E559 Vitamin D deficiency, unspecified: Secondary | ICD-10-CM | POA: Diagnosis not present

## 2023-04-14 DIAGNOSIS — E538 Deficiency of other specified B group vitamins: Secondary | ICD-10-CM | POA: Insufficient documentation

## 2023-04-14 HISTORY — DX: Sleep deprivation: Z72.820

## 2023-04-14 HISTORY — DX: Deficiency of other specified B group vitamins: E53.8

## 2023-04-14 MED ORDER — METFORMIN HCL 500 MG PO TABS: ORAL_TABLET | ORAL | 0 refills | Status: DC

## 2023-04-14 NOTE — Progress Notes (Signed)
[facility]  Office: [phone]  /  Fax: [phone]  WEIGHT SUMMARY AND BIOMETRICS  Vitals Temp: 98.3 F (36.8 C) BP: 103/71 Pulse Rate: 81 SpO2: 97 %   Anthropometric Measurements Height: 5' 0.5" (1.537 m) Weight: 211 lb (95.7 kg) BMI (Calculated): 40.51 Weight at Last Visit: 214 lb Weight Lost Since Last Visit: 3 lb Weight Gained Since Last Visit: 0 Starting Weight: 226 lb Total Weight Loss (lbs): 15 lb (6.804 kg) Peak Weight: 237 lb   Body Composition  Body Fat %: 48.3 % Fat Mass (lbs): 102.2 lbs Muscle Mass (lbs): 103.6 lbs Total Body Water (lbs): 76.6 lbs Visceral Fat Rating : 15   Other Clinical Data Fasting: no Labs: no Today's Visit #: 5 Starting Date: 02/11/23     HPI  Chief Complaint: OBESITY  Sarah Travis is here to discuss her progress with her obesity treatment plan. She is on the the Category 1 Plan and states she is following her eating plan approximately 80 % of the time. She states she is exercising walking 60 minutes 7 times per week.  Discussed the use of AI scribe software for clinical note transcription with the patient, who gave verbal consent to proceed.  History of Present Illness  /    Interval History:  Since last office visit she down 3 lbs.    The patient, a 52 year old female with obesity, presents for a follow-up visit. She reports adherence to the prescribed diet plan and has noticed weight loss. Initially, she struggled with the diet but now finds it easier to follow. Despite the weight loss, she still experiences cravings, which have been managed with metformin. She started with half a tablet and has recently increased to a full 500mg  tablet. She denies any side effects from the medication.  The patient maintains a regular exercise routine, which includes daily walks unless the weather is unfavorable. She reports drinking adequate amounts of water, especially during her walks. Despite these positive lifestyle changes, she reports sleep  deprivation due to her daughter's school schedule. She estimates that she gets around five hours of sleep per night, which has resulted in feelings of dizziness in the mornings.  Pharmacotherapy: metformin 500 mg daily. No GI or other side effects with metformin.   TREATMENT PLAN FOR OBESITY: Obesity Patient has lost 3 pounds of adipose tissue and maintained muscle mass. She is adhering to the dietary plan and reports decreased hunger since starting Metformin. No side effects from Metformin reported. -Continue Metformin 500mg  daily. -Continue current dietary plan and exercise regimen. -Refill Metformin prescription to ensure continuity of medication. -Next appointment with Dr. Sharee Holster on September 17th. Recommended Dietary Goals  Sarah Travis is currently in the action stage of change. As such, her goal is to continue weight management plan. She has agreed to the Category 1 Plan.  Behavioral Intervention  We discussed the following Behavioral Modification Strategies today: increasing lean protein intake, decreasing simple carbohydrates , increasing vegetables, increasing lower glycemic fruits, avoiding skipping meals, increasing water intake, work on managing stress, creating time for self-care and relaxation measures, continue to practice mindfulness when eating, planning for success, and working on getting adequate sleep each night .  Additional resources provided today: NA  Recommended Physical Activity Goals  Sarah Travis has been advised to work up to 150 minutes of moderate intensity aerobic activity a week and strengthening exercises 2-3 times per week for cardiovascular health, weight loss maintenance and preservation of muscle mass.   She has agreed to Continue current level of physical  activity    Pharmacotherapy We discussed various medication options to help Sarah Travis with her weight loss efforts and we both agreed to continue metformin 500 mg daily for prediabetes.    Return in about 3  weeks (around 05/05/2023).Marland Kitchen She was informed of the importance of frequent follow up visits to maximize her success with intensive lifestyle modifications for her multiple health conditions.  PHYSICAL EXAM:  Blood pressure 103/71, pulse 81, temperature 98.3 F (36.8 C), height 5' 0.5" (1.537 m), weight 211 lb (95.7 kg), SpO2 97%. Body mass index is 40.53 kg/m.  General: She is overweight, cooperative, alert, well developed, and in no acute distress. PSYCH: Has normal mood, affect and thought process.   Cardiovascular: HR 80's , BP 103-71 Lungs: Normal breathing effort, no conversational dyspnea. Neuro: no focal deficits  DIAGNOSTIC DATA REVIEWED:  BMET    Component Value Date/Time   NA 141 01/19/2023 1412   K 4.9 01/19/2023 1412   CL 102 01/19/2023 1412   CO2 31 01/19/2023 1412   GLUCOSE 103 (H) 01/19/2023 1412   BUN 15 01/19/2023 1412   CREATININE 0.46 01/19/2023 1412   CALCIUM 9.8 01/19/2023 1412   Lab Results  Component Value Date   HGBA1C 6.2 (H) 02/11/2023   HGBA1C 6.0 (H) 10/18/2010   Lab Results  Component Value Date   INSULIN 8.3 02/11/2023   Lab Results  Component Value Date   TSH 0.36 01/19/2023   CBC    Component Value Date/Time   WBC 6.0 01/19/2023 1412   RBC 4.46 01/19/2023 1412   HGB 12.3 01/19/2023 1412   HCT 38.9 01/19/2023 1412   PLT 339.0 01/19/2023 1412   MCV 87.2 01/19/2023 1412   MCHC 31.7 01/19/2023 1412   RDW 13.4 01/19/2023 1412   Iron Studies    Component Value Date/Time   FERRITIN 81 10/18/2010 2026   Lipid Panel     Component Value Date/Time   CHOL 274 (H) 02/11/2023 1017   TRIG 84 02/11/2023 1017   HDL 69 02/11/2023 1017   LDLCALC 191 (H) 02/11/2023 1017   Hepatic Function Panel     Component Value Date/Time   PROT 7.2 01/19/2023 1412   ALBUMIN 4.1 01/19/2023 1412   AST 19 01/19/2023 1412   ALT 15 01/19/2023 1412   ALKPHOS 86 01/19/2023 1412   BILITOT 0.2 01/19/2023 1412      Component Value Date/Time   TSH 0.36  01/19/2023 1412   Nutritional Lab Results  Component Value Date   VD25OH 26.57 (L) 01/19/2023    ASSOCIATED CONDITIONS ADDRESSED TODAY  ASSESSMENT AND PLAN  Problem List Items Addressed This Visit     Morbid obesity (HCC)   Relevant Medications   metFORMIN (GLUCOPHAGE) 500 MG tablet   Vitamin D deficiency   Prediabetes- new onset - Primary   Relevant Medications   metFORMIN (GLUCOPHAGE) 500 MG tablet   B12 deficiency   Sleep deprivation   BMI 40.0-44.9, adult (HCC) Current BMI 40.6   Relevant Medications   metFORMIN (GLUCOPHAGE) 500 MG tablet   Prediabetes Last A1c was 6.2- increasing and not at goal.   Medication(s):  metformin 500 mg daily at lunch.   No GI or other side effects with metformin.  Polyphagia:No Lab Results  Component Value Date   HGBA1C 6.2 (H) 02/11/2023   HGBA1C 6.0 (H) 10/18/2010   Lab Results  Component Value Date   INSULIN 8.3 02/11/2023    Plan: Continue and refill  metformin 500 mg daily.  Continue working  on nutrition plan to decrease simple carbohydrates, increase lean proteins and exercise to promote weight loss, improve glycemic control and prevent progression to Type 2 diabetes.   Patient expressed concern about potential loose skin with weight loss. -Educate patient on benefits of slow, steady weight loss and maintaining hydration and skin care. -Consider referral to a dermatologist or plastic surgeon if skin laxity becomes a significant concern in the future.   Vitamin D Deficiency Vitamin D is not at goal of 50.  Most recent vitamin D level was 26.6. She is on OTC vitamin D3 5000 IU daily. Lab Results  Component Value Date   VD25OH 26.57 (L) 01/19/2023    Plan: Continue OTC vitamin D3 5000 IU daily Low vitamin D levels can be associated with adiposity and may result in leptin resistance and weight gain. Also associated with fatigue. Currently on vitamin D supplementation without any adverse effects.  Recheck vitamin D level  in 2-3 months to optimize supplementation.   Sleep Deprivation Patient reports getting approximately 5 hours of sleep per night due to responsibilities and early morning routine. -Encourage patient to aim for a minimum of 7 hours of sleep per night for optimal health and weight loss. -Consider earlier bedtime to accommodate early morning wake-up time.    ATTESTASTION STATEMENTS:  Reviewed by clinician on day of visit: allergies, medications, problem list, medical history, surgical history, family history, social history, and previous encounter notes.   I have personally spent 30 minutes total time today in preparation, patient care, nutritional counseling and documentation for this visit, including the following: review of clinical lab tests; review of medical tests/procedures/services.      Fordyce Lepak, PA-C

## 2023-05-05 ENCOUNTER — Encounter (INDEPENDENT_AMBULATORY_CARE_PROVIDER_SITE_OTHER): Payer: Self-pay | Admitting: Family Medicine

## 2023-05-05 ENCOUNTER — Ambulatory Visit (INDEPENDENT_AMBULATORY_CARE_PROVIDER_SITE_OTHER): Payer: 59 | Admitting: Family Medicine

## 2023-05-05 VITALS — BP 106/64 | HR 72 | Temp 98.4°F | Ht 60.5 in | Wt 208.0 lb

## 2023-05-05 DIAGNOSIS — E559 Vitamin D deficiency, unspecified: Secondary | ICD-10-CM | POA: Diagnosis not present

## 2023-05-05 DIAGNOSIS — R7303 Prediabetes: Secondary | ICD-10-CM

## 2023-05-05 DIAGNOSIS — Z6841 Body Mass Index (BMI) 40.0 and over, adult: Secondary | ICD-10-CM

## 2023-05-05 DIAGNOSIS — Z6839 Body mass index (BMI) 39.0-39.9, adult: Secondary | ICD-10-CM | POA: Diagnosis not present

## 2023-05-05 MED ORDER — METFORMIN HCL 500 MG PO TABS: ORAL_TABLET | ORAL | 0 refills | Status: DC

## 2023-05-05 NOTE — Progress Notes (Signed)
Carlye Grippe, D.O.  ABFM, ABOM Specializing in Clinical Bariatric Medicine  Office located at: [address] [place], [place]  [address]     Assessment and Plan:   Medications Discontinued During This Encounter  Medication Reason   metFORMIN (GLUCOPHAGE) 500 MG tablet Reorder     Meds ordered this encounter  Medications   metFORMIN (GLUCOPHAGE) 500 MG tablet    Sig: 1 po with lunch and dinner daily    Dispense:  60 tablet    Refill:  0    Next OV obtain fasting labs (insulin, A1c, magnesium, Vit D, B12, cholesterol, and any other labs that [name] deems necessary).  Prediabetes Assessment & Plan: Lab Results  Component Value Date   HGBA1C 6.2 (H) 02/11/2023   HGBA1C 6.0 (H) 10/18/2010   INSULIN 8.3 02/11/2023   Prediabetes is treated with Metformin 500 mg daily - tolerating medication well, denies any side effects. Pt restarted her meal plan yesterday as she was out of town for 1 wk caring for her sister. She will observe how her hunger and cravings are the next 3-4 days and has been instructed to increase her Metformin to twice daily if needed. Will continue to monitor condition.    Vitamin D deficiency Assessment & Plan: Lab Results  Component Value Date   VD25OH 26.57 (L) 01/19/2023   No issues with OTC Vitamin D 5,000 units daily, tolerating well. Continue with supplementation at current dose, will recheck levels in ~ 1 mo.    BMI 40.0-44.9, adult (HCC) Current BMI 39.94 Morbid obesity (HCC)- 42.05- starting BMI 02/11/23 44.2 Assessment & Plan: Since last office visit on 04/14/23 patient's  Muscle mass has decreased by 0.8 lb. Fat mass has decreased by 1.6 lb. Total body water has decreased by 2.6 lb.  Counseling done on how various foods will affect these numbers and how to maximize success  Total lbs lost to date: 18 lbs  Total weight loss percentage to date: 7.96%   No change to meal plan - see Subjective   Behavioral Intervention Additional  resources provided today:  NA Evidence-based interventions for health behavior change were utilized today including the discussion of self monitoring techniques, problem-solving barriers and SMART goal setting techniques.   Regarding patient's less desirable eating habits and patterns, we employed the technique of small changes.  Pt will specifically work on: increasing walking to 40-60 minutes, 4 days a wk.   FOLLOW UP: Return 05/26/23. She was informed of the importance of frequent follow up visits to maximize her success with intensive lifestyle modifications for her multiple health conditions.  Subjective:   Chief complaint: Obesity Sarah Travis is here to discuss her progress with her obesity treatment plan. She is on the Category 1 Plan and states she is following her eating plan approximately 70% of the time. She states she is walking 30 minutes, 4 days per week.  Interval History:  Sarah Travis is here for a follow up office visit. Since last OV, Sarah Travis reports that it has been challenging to follow her meal plan because she went to [place] to take care of her sister. She returned home yesterday and has restarted with the meal plan. She endorses that 5-6 hrs of sleep per night has been her norm for years now.  Pharmacotherapy for weight loss: She is currently taking  Metformin 500 mg daily  for medical weight loss.  Denies side effects.    Review of Systems:  Pertinent positives were addressed with  patient today.  Reviewed by clinician on day of visit: allergies, medications, problem list, medical history, surgical history, family history, social history, and previous encounter notes.  Weight Summary and Biometrics   Weight Lost Since Last Visit: 3 lb  Weight Gained Since Last Visit: 0   Vitals Temp: 98.4 F (36.9 C) BP: 106/64 Pulse Rate: 72 SpO2: 98 %   Anthropometric Measurements Height: 5' 0.5" (1.537 m) Weight: 208 lb (94.3 kg) BMI (Calculated): 39.94 Weight at  Last Visit: 211 lb Weight Lost Since Last Visit: 3 lb Weight Gained Since Last Visit: 0 Starting Weight: 226 lb Total Weight Loss (lbs): 18 lb (8.165 kg) Peak Weight: 237 lb   Body Composition  Body Fat %: 48.2 % Fat Mass (lbs): 100.6 lbs Muscle Mass (lbs): 102.8 lbs Total Body Water (lbs): 74 lbs Visceral Fat Rating : 15   Other Clinical Data Fasting: no Labs: no Today's Visit #: 6 Starting Date: 02/11/23   Objective:   PHYSICAL EXAM: Blood pressure 106/64, pulse 72, temperature 98.4 F (36.9 C), height 5' 0.5" (1.537 m), weight 208 lb (94.3 kg), SpO2 98%. Body mass index is 39.95 kg/m.  General: Well Developed, well nourished, and in no acute distress.  HEENT: Normocephalic, atraumatic Skin: Warm and dry, cap RF less 2 sec, good turgor Chest:  Normal excursion, shape, no gross abn Respiratory: speaking in full sentences, no conversational dyspnea NeuroM-Sk: Ambulates w/o assistance, moves * 4 Psych: A and O *3, insight good, mood-full  DIAGNOSTIC DATA REVIEWED:  BMET    Component Value Date/Time   NA 141 01/19/2023 1412   K 4.9 01/19/2023 1412   CL 102 01/19/2023 1412   CO2 31 01/19/2023 1412   GLUCOSE 103 (H) 01/19/2023 1412   BUN 15 01/19/2023 1412   CREATININE 0.46 01/19/2023 1412   CALCIUM 9.8 01/19/2023 1412   Lab Results  Component Value Date   HGBA1C 6.2 (H) 02/11/2023   HGBA1C 6.0 (H) 10/18/2010   Lab Results  Component Value Date   INSULIN 8.3 02/11/2023   Lab Results  Component Value Date   TSH 0.36 01/19/2023   CBC    Component Value Date/Time   WBC 6.0 01/19/2023 1412   RBC 4.46 01/19/2023 1412   HGB 12.3 01/19/2023 1412   HCT 38.9 01/19/2023 1412   PLT 339.0 01/19/2023 1412   MCV 87.2 01/19/2023 1412   MCHC 31.7 01/19/2023 1412   RDW 13.4 01/19/2023 1412   Iron Studies    Component Value Date/Time   FERRITIN 81 10/18/2010 2026   Lipid Panel     Component Value Date/Time   CHOL 274 (H) 02/11/2023 1017   TRIG 84  02/11/2023 1017   HDL 69 02/11/2023 1017   LDLCALC 191 (H) 02/11/2023 1017   Hepatic Function Panel     Component Value Date/Time   PROT 7.2 01/19/2023 1412   ALBUMIN 4.1 01/19/2023 1412   AST 19 01/19/2023 1412   ALT 15 01/19/2023 1412   ALKPHOS 86 01/19/2023 1412   BILITOT 0.2 01/19/2023 1412      Component Value Date/Time   TSH 0.36 01/19/2023 1412   Nutritional Lab Results  Component Value Date   VD25OH 26.57 (L) 01/19/2023    Attestations:   I, Special Puri, acting as a Stage manager for [employer], DO., have compiled all relevant documentation for today's office visit on behalf of Thomasene Lot, DO, while in the presence of [employer], DO.  I have reviewed the above documentation for accuracy  and completeness, and I agree with the above. Carlye Grippe, D.O.  The 21st Century Cures Act was signed into law in 2016 which includes the topic of electronic health records.  This provides immediate access to information in MyChart.  This includes consultation notes, operative notes, office notes, lab results and pathology reports.  If you have any questions about what you read please let us know at your next visit so we can discuss your concerns and take corrective action if need be.  We are right here with you.

## 2023-05-26 ENCOUNTER — Ambulatory Visit (INDEPENDENT_AMBULATORY_CARE_PROVIDER_SITE_OTHER): Payer: 59 | Admitting: Physician Assistant

## 2023-06-11 ENCOUNTER — Other Ambulatory Visit (INDEPENDENT_AMBULATORY_CARE_PROVIDER_SITE_OTHER): Payer: Self-pay | Admitting: Family Medicine

## 2023-06-11 DIAGNOSIS — R7303 Prediabetes: Secondary | ICD-10-CM

## 2023-06-16 ENCOUNTER — Ambulatory Visit (INDEPENDENT_AMBULATORY_CARE_PROVIDER_SITE_OTHER): Payer: 59 | Admitting: Family Medicine

## 2023-06-16 ENCOUNTER — Encounter (INDEPENDENT_AMBULATORY_CARE_PROVIDER_SITE_OTHER): Payer: Self-pay | Admitting: Family Medicine

## 2023-06-16 VITALS — BP 111/77 | HR 80 | Temp 98.7°F | Ht 60.5 in | Wt 210.0 lb

## 2023-06-16 DIAGNOSIS — Z6841 Body Mass Index (BMI) 40.0 and over, adult: Secondary | ICD-10-CM

## 2023-06-16 DIAGNOSIS — R7303 Prediabetes: Secondary | ICD-10-CM | POA: Diagnosis not present

## 2023-06-16 DIAGNOSIS — E538 Deficiency of other specified B group vitamins: Secondary | ICD-10-CM

## 2023-06-16 DIAGNOSIS — R638 Other symptoms and signs concerning food and fluid intake: Secondary | ICD-10-CM | POA: Diagnosis not present

## 2023-06-16 DIAGNOSIS — E559 Vitamin D deficiency, unspecified: Secondary | ICD-10-CM | POA: Diagnosis not present

## 2023-06-16 MED ORDER — TOPIRAMATE 25 MG PO TABS: ORAL_TABLET | ORAL | 0 refills | Status: DC

## 2023-06-16 MED ORDER — METFORMIN HCL 500 MG PO TABS: ORAL_TABLET | ORAL | 0 refills | Status: DC

## 2023-06-16 NOTE — Progress Notes (Signed)
Sarah Travis, D.O.  ABFM, ABOM Specializing in Clinical Bariatric Medicine  Office located at: [address] [place], [place]  [address]     Assessment and Plan:  Review labs next OV with pt.  Orders Placed This Encounter  Procedures   Hemoglobin A1c   Insulin, random   Lipid Panel With LDL/HDL Ratio   VITAMIN D 25 Hydroxy (Vit-D Deficiency, Fractures)   Vitamin B12   Medications Discontinued During This Encounter  Medication Reason   metFORMIN (GLUCOPHAGE) 500 MG tablet     Meds ordered this encounter  Medications   metFORMIN (GLUCOPHAGE) 500 MG tablet    Sig: 1 po with lunch daily    Dispense:  30 tablet    Refill:  0   topiramate (TOPAMAX) 25 MG tablet    Sig: One half tab by mouth daily for a week, then 1 po qd    Dispense:  30 tablet    Refill:  0    Abnormal craving Assessment: Condition is Not optimized.. Pt endorses having hunger and cravings.She mainly craves sweets and carbs such as cookies and pasta.   Plan: - Begin Topiramate 25mg  once daily before bed.   - I re-explained role that simple carbs and sugars have on hunger and cravings   Vitamin D deficiency Assessment: Condition is Not optimized.. This is being treated with OTC Vitamin D and she tolerates this well. Pt endorses being complaint and taking this regularly. She denies any adverse side effects.  Lab Results  Component Value Date   VD25OH 26.57 (L) 01/19/2023   Plan: - Continue OTC Cholecalciferol 5,000 lU daily.   - ideal vitamin D levels reviewed with patient    - Informed patient this may be a lifelong thing, and she was encouraged to continue to take the medicine until told otherwise.     - weight loss will likely improve availability of vitamin D, thus encouraged Sarah Travis to continue with meal plan and their weight loss efforts to further improve this condition.  Thus, we will need to monitor levels regularly (every 3-4 mo on average) to keep levels within normal limits and  prevent over supplementation.   Prediabetes Assessment: Condition is Not optimized.. Pt informed me since her increase of Metformin BID last OV she began to feel dizzy, "spaced out", and unsteady. Due to this she decreased her dosage back to once a day and symptoms resolved. Pt endorses still having hunger and cravings.  Lab Results  Component Value Date   HGBA1C 6.2 (H) 02/11/2023   HGBA1C 6.0 (H) 10/18/2010   INSULIN 8.3 02/11/2023    Plan: - Continue Metformin at current dose once daily.   - In addition, we discussed the risks and benefits of various medication options which can help [place] in the management of this disease process as well as with weight loss.  Will consider starting one of these meds in future as we will focus on prudent nutritional plan at this time.   - Contact her insurance company to find out more information on coverage of Wegovy, Zepbound, etc.   - Continue to decrease simple carbs/ sugars; increase fiber and proteins -> follow her meal plan.    - Anticipatory guidance given.    - Sarah Travis will continue to work on weight loss, exercise, via their meal plan we devised to help decrease the risk of progressing to diabetes.    TREATMENT PLAN FOR OBESITY: BMI 40.0-44.9, adult (HCC) Current BMI 40.32 Morbid obesity (HCC)- 42.05-  starting BMI 02/11/23 44.2 Assessment:  Sarah Travis is here to discuss her progress with her obesity treatment plan along with follow-up of her obesity related diagnoses. See Medical Weight Management Flowsheet for complete bioelectrical impedance results.  Condition is not optimized. Biometric data collected today, was reviewed with patient.   Since last office visit on 05/05/2023 patient's  Muscle mass has decreased by 1.8lb. Fat mass has increased by 2.8lb. Total body water has decreased by 0.4lb.  Counseling done on how various foods will affect these numbers and how to maximize success  Total lbs lost to date: 16 Total weight loss  percentage to date: 7.08%   Plan: - Sybel will work on healthier eating habits and following the Category 1 meal plan.   Behavioral Intervention Additional resources provided today: patient declined Evidence-based interventions for health behavior change were utilized today including the discussion of self monitoring techniques, problem-solving barriers and SMART goal setting techniques.   Regarding patient's less desirable eating habits and patterns, we employed the technique of small changes.  Pt will specifically work on: Do some movement everyday to help with stress for next visit.     She has agreed to Think about enjoyable ways to increase daily physical activity and overcoming barriers to exercise and Increase the intensity, frequency or duration of aerobic exercises     FOLLOW UP: Return in about 2 weeks (around 06/30/2023).  She was informed of the importance of frequent follow up visits to maximize her success with intensive lifestyle modifications for her multiple health conditions. Sarah Travis is aware that we will review all of her lab results at our next visit together in person.  She is aware that if anything is critical/ life threatening with the results, we will be contacting her via MyChart or by my CMA will be calling them prior to the office visit to discuss acute management.    Subjective:   Chief complaint: Obesity Sarah Travis is here to discuss her progress with her obesity treatment plan. She is on the the Category 1 Plan and states she is following her eating plan approximately 75% of the time. She states she is walking 45 minutes 3 days per week.  Interval History:  Sarah Travis is here for a follow up office visit.     Since last office visit:  Pt states that since last OV she has been well. She has tried to follow the meal plan to the best of her ability and notes eating off plan occasionally such as breads and other carbs. She typically craves carbs  and sweets.   We reviewed her meal plan and all questions were answered.   Pharmacotherapy for weight loss: She is currently taking  Metformin  for medical weight loss.  Denies side effects.    Review of Systems:  Pertinent positives were addressed with patient today.  Reviewed by clinician on day of visit: allergies, medications, problem list, medical history, surgical history, family history, social history, and previous encounter notes.  Weight Summary and Biometrics   Weight Lost Since Last Visit: 0lb  Weight Gained Since Last Visit: 2lb   Vitals Temp: 98.7 F (37.1 C) BP: 111/77 Pulse Rate: 80 SpO2: 100 %   Anthropometric Measurements Height: 5' 0.5" (1.537 m) Weight: 210 lb (95.3 kg) BMI (Calculated): 40.32 Weight at Last Visit: 208lb Weight Lost Since Last Visit: 0lb Weight Gained Since Last Visit: 2lb Starting Weight: 226lb Total Weight Loss (lbs): 16 lb (7.258 kg) Peak Weight: 237  lb   Body Composition  Body Fat %: 49.2 % Fat Mass (lbs): 103.4 lbs Muscle Mass (lbs): 101.4 lbs Total Body Water (lbs): 73.6 lbs Visceral Fat Rating : 15   Other Clinical Data Fasting: no Labs: no Today's Visit #: 7 Starting Date: 02/11/23     Objective:   PHYSICAL EXAM: Blood pressure 111/77, pulse 80, temperature 98.7 F (37.1 C), height 5' 0.5" (1.537 m), weight 210 lb (95.3 kg), SpO2 100%. Body mass index is 40.34 kg/m.  General: Well Developed, well nourished, and in no acute distress.  HEENT: Normocephalic, atraumatic Skin: Warm and dry, cap RF less 2 sec, good turgor Chest:  Normal excursion, shape, no gross abn Respiratory: speaking in full sentences, no conversational dyspnea NeuroM-Sk: Ambulates w/o assistance, moves * 4 Psych: A and O *3, insight good, mood-full  DIAGNOSTIC DATA REVIEWED:  BMET    Component Value Date/Time   NA 141 01/19/2023 1412   K 4.9 01/19/2023 1412   CL 102 01/19/2023 1412   CO2 31 01/19/2023 1412   GLUCOSE 103 (H)  01/19/2023 1412   BUN 15 01/19/2023 1412   CREATININE 0.46 01/19/2023 1412   CALCIUM 9.8 01/19/2023 1412   Lab Results  Component Value Date   HGBA1C 6.2 (H) 02/11/2023   HGBA1C 6.0 (H) 10/18/2010   Lab Results  Component Value Date   INSULIN 8.3 02/11/2023   Lab Results  Component Value Date   TSH 0.36 01/19/2023   CBC    Component Value Date/Time   WBC 6.0 01/19/2023 1412   RBC 4.46 01/19/2023 1412   HGB 12.3 01/19/2023 1412   HCT 38.9 01/19/2023 1412   PLT 339.0 01/19/2023 1412   MCV 87.2 01/19/2023 1412   MCHC 31.7 01/19/2023 1412   RDW 13.4 01/19/2023 1412   Iron Studies    Component Value Date/Time   FERRITIN 81 10/18/2010 2026   Lipid Panel     Component Value Date/Time   CHOL 274 (H) 02/11/2023 1017   TRIG 84 02/11/2023 1017   HDL 69 02/11/2023 1017   LDLCALC 191 (H) 02/11/2023 1017   Hepatic Function Panel     Component Value Date/Time   PROT 7.2 01/19/2023 1412   ALBUMIN 4.1 01/19/2023 1412   AST 19 01/19/2023 1412   ALT 15 01/19/2023 1412   ALKPHOS 86 01/19/2023 1412   BILITOT 0.2 01/19/2023 1412      Component Value Date/Time   TSH 0.36 01/19/2023 1412   Nutritional Lab Results  Component Value Date   VD25OH 26.57 (L) 01/19/2023    Attestations:   I, Clinical biochemist, acting as a Stage manager for [employer], DO., have compiled all relevant documentation for today's office visit on behalf of Thomasene Lot, DO, while in the presence of [employer], DO.  I have reviewed the above documentation for accuracy and completeness, and I agree with the above. Sarah Travis, D.O.  The 21st Century Cures Act was signed into law in 2016 which includes the topic of electronic health records.  This provides immediate access to information in MyChart.  This includes consultation notes, operative notes, office notes, lab results and pathology reports.  If you have any questions about what you read please let us know at your next visit so  we can discuss your concerns and take corrective action if need be.  We are right here with you.

## 2023-06-17 LAB — HEMOGLOBIN A1C
Est. average glucose Bld gHb Est-mCnc: 146 mg/dL
Hgb A1c MFr Bld: 6.7 % — ABNORMAL HIGH (ref 4.8–5.6)

## 2023-06-17 LAB — VITAMIN D 25 HYDROXY (VIT D DEFICIENCY, FRACTURES): Vit D, 25-Hydroxy: 37.7 ng/mL (ref 30.0–100.0)

## 2023-06-17 LAB — LIPID PANEL WITH LDL/HDL RATIO
Cholesterol, Total: 269 mg/dL — ABNORMAL HIGH (ref 100–199)
HDL: 67 mg/dL (ref >39)
LDL Chol Calc (NIH): 190 mg/dL — ABNORMAL HIGH (ref 0–99)
LDL/HDL Ratio: 2.8 ratio (ref 0.0–3.2)
Triglycerides: 72 mg/dL (ref 0–149)
VLDL Cholesterol Cal: 12 mg/dL (ref 5–40)

## 2023-06-17 LAB — INSULIN, RANDOM: INSULIN: 6.8 u[IU]/mL (ref 2.6–24.9)

## 2023-06-17 LAB — VITAMIN B12: Vitamin B-12: 355 pg/mL (ref 232–1245)

## 2023-07-02 ENCOUNTER — Ambulatory Visit (INDEPENDENT_AMBULATORY_CARE_PROVIDER_SITE_OTHER): Payer: 59 | Admitting: Family Medicine

## 2023-07-02 ENCOUNTER — Encounter (INDEPENDENT_AMBULATORY_CARE_PROVIDER_SITE_OTHER): Payer: Self-pay | Admitting: Family Medicine

## 2023-07-02 VITALS — BP 133/83 | HR 75 | Temp 98.6°F | Ht 60.5 in | Wt 211.0 lb

## 2023-07-02 DIAGNOSIS — E538 Deficiency of other specified B group vitamins: Secondary | ICD-10-CM

## 2023-07-02 DIAGNOSIS — E1169 Type 2 diabetes mellitus with other specified complication: Secondary | ICD-10-CM | POA: Diagnosis not present

## 2023-07-02 DIAGNOSIS — Z6841 Body Mass Index (BMI) 40.0 and over, adult: Secondary | ICD-10-CM | POA: Diagnosis not present

## 2023-07-02 DIAGNOSIS — E782 Mixed hyperlipidemia: Secondary | ICD-10-CM | POA: Diagnosis not present

## 2023-07-02 DIAGNOSIS — E559 Vitamin D deficiency, unspecified: Secondary | ICD-10-CM | POA: Diagnosis not present

## 2023-07-02 DIAGNOSIS — E669 Obesity, unspecified: Secondary | ICD-10-CM

## 2023-07-02 DIAGNOSIS — Z7984 Long term (current) use of oral hypoglycemic drugs: Secondary | ICD-10-CM | POA: Diagnosis not present

## 2023-07-02 MED ORDER — METFORMIN HCL ER 500 MG PO TB24: 500.0000 mg | ORAL_TABLET | Freq: Every day | ORAL | 0 refills | Status: DC

## 2023-07-02 MED ORDER — VITAMIN D (ERGOCALCIFEROL) 1.25 MG (50000 UNIT) PO CAPS: 50000.0000 [IU] | ORAL_CAPSULE | ORAL | 0 refills | Status: DC

## 2023-07-02 MED ORDER — B COMPLEX VITAMINS PO CAPS
ORAL_CAPSULE | ORAL | Status: AC
Start: 2023-07-02 — End: ?

## 2023-07-02 NOTE — Progress Notes (Signed)
Sarah Travis, D.O.  ABFM, ABOM Specializing in Clinical Bariatric Medicine  Office located at: [address] [place], [place]  [address]   Assessment and Plan:   FOR THE DISEASE OF OBESITY: BMI 40.0-44.9, adult (HCC) Current BMI 40.32 Morbid obesity (HCC)- 42.05- starting BMI 02/11/23 44.2  Since last office visit on 06/16/23 patient's muscle mass has decreased by 0.2 lb. Fat mass has increased by 1.4 lb. Total body water has increased by 0.4 lb.  Counseling done on how various foods will affect these numbers and how to maximize success  Total lbs lost to date: 15 lbs  Total weight loss percentage to date: 6.64%    Recommended Dietary Goals [name] is currently in the action stage of change. As such, her goal is to continue weight management plan.  She has agreed to: continue current plan   Behavioral Intervention We discussed the following Behavioral Modification Strategies today: increasing lean protein intake to established goals and decreasing simple carbohydrates .  Additional resources provided today:  Handout on Category 1 MP   Evidence-based interventions for health behavior change were utilized today including the discussion of self monitoring techniques, problem-solving barriers and SMART goal setting techniques.   Regarding patient's less desirable eating habits and patterns, we employed the technique of small changes.   Pt will specifically work on:  n/a   Recommended Physical Activity Goals Clorine has been advised to work up to 150 minutes of moderate intensity aerobic activity a week and strengthening exercises 2-3 times per week for cardiovascular health, weight loss maintenance and preservation of muscle mass.   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 Fasting insulin levels have slightly improved from 8.3 to 6.8.  Pt currently on Metformin 500 mg one tablet at lunch and 0.5 tablet at dinner- pt does endorse having nausea as a SE.   Intensive lifestyle modification including diet, exercise and weight loss are the first line of treatment for diabetes. We extensively discussed the importance of decreasing simple carbs, increasing proteins and how certain foods they eat will affect  this condition. Additionally, due to side effects from the short-release Metformin, we will switch to Metformin XR 500 mg once daily . Will consider initiating Mounjaro in the future. Pt encouraged to visit [email or web address]. Importance of f/up with PCP was stressed to the patient today.  Orders: -     metFORMIN HCl ER; Take 1 tablet (500 mg total) by mouth daily with breakfast.  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 Diet/exercise approach. LDL is elevated at 190. Goal < 70 for diabatic.   I stressed the importance that patient continue with our prudent nutritional plan that is low in saturated and trans fats, and  low in fatty carbs to improve these numbers. Additionally, pt advised to discuss with PCP at next OV about initiating statin therapy.    Vitamin D deficiency Assessment & Plan: Lab Results  Component Value Date   VD25OH 37.7 06/16/2023   VD25OH 26.57 (L) 01/19/2023   Pt currently on OTC Cholecaliferol 5,000 units once daily. Vitamin D levels  have improved since last checked, however are still not within the recommended limits of 50 to 70-80.   Pt advised to discontinue OTC vitamin D and begin ERGO 50,000 units once weekly. Continue with their weight loss efforts.   Orders: -     Vitamin D (Ergocalciferol); Take 1 capsule (50,000 Units total) by mouth every 7 (seven) days.  Dispense: 4 capsule; Refill: 0   B12 deficiency Assessment & Plan: Lab Results  Component Value Date   VITAMINB12 355 06/16/2023   B12 level is 355, not at goal of over 500. Pt reports that after seeing labs from 10/29 , she has been taking her b-complex only twice weekly.   Pt instructed to take B12 complex every day until told otherwise and continue to focus on B12 rich foods.   Orders:  -     B Complex Vitamins; Take at least 500 mcg b12 daily   FOLLOW UP: Return 07/23/23. She was informed of the importance of frequent follow up visits to maximize her success with intensive lifestyle modifications for her multiple health conditions.  Subjective:   Chief complaint: Obesity Sarah Travis is here to discuss her progress with her obesity treatment plan. She is on the Category 1 Plan and states she is following her eating plan approximately 75% of the time. She states she is not exercising.   Interval History:  Sarah Travis is here for a follow up office visit. Since last OV, Khennedi is up 1 lb. For breakfast she has been having waffles or dates with a 1/2 cup of fair life skim milk. Additionaly, LOV we started pt on Topiramate 25 mg once daily due to her persistent sweet and carb cravings. She started with 0.5 tablet and then increased to a full tablet. Unfortunately, she experienced dizziness, difficulty sleeping, and nausea and stopped the medication.   Barriers identified: none   Pharmacotherapy for weight loss: She is currently taking Metformin 500 mg one tablet at lunch and 0.5 tablet at dinner- pt does endorse having nausea as a SE.   Review of  Systems:  Pertinent positives were addressed with patient today.  Reviewed by clinician on day of visit: allergies, medications, problem list, medical history, surgical history, family history, social history, and previous encounter notes.  Weight Summary and Biometrics   Weight Lost Since Last Visit: 0lb  Weight Gained Since Last Visit: 1lb   Vitals Temp: 98.6 F (37 C) BP: 133/83 Pulse Rate: 75 SpO2: 99 %   Anthropometric Measurements Height: 5' 0.5" (1.537 m) Weight: 211 lb (95.7 kg) BMI (Calculated): 40.51 Weight at Last Visit: 210lb Weight Lost Since Last Visit: 0lb Weight Gained Since Last Visit: 1lb Starting Weight: 226lb Total Weight Loss (lbs): 15 lb (6.804 kg) Peak Weight: 237 lb   Body Composition  Body Fat %: 49.6 % Fat Mass (lbs): 104.8 lbs Muscle Mass (lbs): 101.2 lbs Total Body Water (lbs): 74 lbs Visceral Fat Rating : 15   Other Clinical Data Fasting: no Labs: no Today's Visit #: 8 Starting Date: 02/11/23   Objective:   PHYSICAL EXAM: Blood pressure 133/83, pulse 75, temperature 98.6 F (37  C), height 5' 0.5" (1.537 m), weight 211 lb (95.7 kg), SpO2 99%. Body mass index is 40.53 kg/m.  General: she is overweight, cooperative and in no acute distress. PSYCH: Has normal mood, affect and thought process.   HEENT: EOMI, sclerae are anicteric. Lungs: Normal breathing effort, no conversational dyspnea. Extremities: Moves * 4 Neurologic: A and O * 3, good insight  DIAGNOSTIC DATA REVIEWED: BMET    Component Value Date/Time   NA 141 01/19/2023 1412   K 4.9 01/19/2023 1412   CL 102 01/19/2023 1412   CO2 31 01/19/2023 1412   GLUCOSE 103 (H) 01/19/2023 1412   BUN 15 01/19/2023 1412   CREATININE 0.46 01/19/2023 1412   CALCIUM 9.8 01/19/2023 1412   Lab Results  Component Value Date   HGBA1C 6.7 (H) 06/16/2023   HGBA1C 6.0 (H) 10/18/2010   Lab Results  Component Value Date   INSULIN 6.8 06/16/2023   INSULIN 8.3 02/11/2023   Lab  Results  Component Value Date   TSH 0.36 01/19/2023   CBC    Component Value Date/Time   WBC 6.0 01/19/2023 1412   RBC 4.46 01/19/2023 1412   HGB 12.3 01/19/2023 1412   HCT 38.9 01/19/2023 1412   PLT 339.0 01/19/2023 1412   MCV 87.2 01/19/2023 1412   MCHC 31.7 01/19/2023 1412   RDW 13.4 01/19/2023 1412   Iron Studies    Component Value Date/Time   FERRITIN 81 10/18/2010 2026   Lipid Panel     Component Value Date/Time   CHOL 269 (H) 06/16/2023 1135   TRIG 72 06/16/2023 1135   HDL 67 06/16/2023 1135   LDLCALC 190 (H) 06/16/2023 1135   Hepatic Function Panel     Component Value Date/Time   PROT 7.2 01/19/2023 1412   ALBUMIN 4.1 01/19/2023 1412   AST 19 01/19/2023 1412   ALT 15 01/19/2023 1412   ALKPHOS 86 01/19/2023 1412   BILITOT 0.2 01/19/2023 1412      Component Value Date/Time   TSH 0.36 01/19/2023 1412   Nutritional Lab Results  Component Value Date   VD25OH 37.7 06/16/2023   VD25OH 26.57 (L) 01/19/2023    Attestations:   I, Special Puri, acting as a Stage manager for Thomasene Lot, DO., have compiled all relevant documentation for today's office visit on behalf of Thomasene Lot, DO, while in the presence of [employer], DO.  Reviewed by clinician on day of visit: allergies, medications, problem list, medical history, surgical history, family history, social history, and previous encounter notes pertinent to patient's obesity diagnosis. I have spent 50 minutes in the care of the patient today including: preparing to see patient (e.g. review and interpretation of tests, old notes ), obtaining and/or reviewing separately obtained history, performing a medically appropriate examination or evaluation, counseling and educating the patient, ordering medications, test or procedures, documenting clinical information in the electronic or other health care record, and independently interpreting results and communicating results to the patient, family, or  caregiver   I have reviewed the above documentation for accuracy and completeness, and I agree with the above. Sarah Travis, D.O.  The 21st Century Cures Act was signed into law in 2016 which includes the topic of electronic health records.  This provides immediate access to information in MyChart.  This includes consultation notes, operative notes, office notes, lab results and pathology reports.  If you have any questions about what you read please let us know at your next visit so we can discuss your  concerns and take corrective action if need be.  We are right here with you.

## 2023-07-13 ENCOUNTER — Ambulatory Visit (INDEPENDENT_AMBULATORY_CARE_PROVIDER_SITE_OTHER): Payer: 59 | Admitting: Family Medicine

## 2023-07-13 ENCOUNTER — Encounter (HOSPITAL_BASED_OUTPATIENT_CLINIC_OR_DEPARTMENT_OTHER): Payer: Self-pay | Admitting: Family Medicine

## 2023-07-13 VITALS — BP 120/70 | HR 83 | Ht 62.0 in | Wt 213.6 lb

## 2023-07-13 DIAGNOSIS — E119 Type 2 diabetes mellitus without complications: Secondary | ICD-10-CM | POA: Diagnosis not present

## 2023-07-13 DIAGNOSIS — E1169 Type 2 diabetes mellitus with other specified complication: Secondary | ICD-10-CM | POA: Insufficient documentation

## 2023-07-13 DIAGNOSIS — E042 Nontoxic multinodular goiter: Secondary | ICD-10-CM | POA: Diagnosis not present

## 2023-07-13 DIAGNOSIS — Z1211 Encounter for screening for malignant neoplasm of colon: Secondary | ICD-10-CM

## 2023-07-13 DIAGNOSIS — E785 Hyperlipidemia, unspecified: Secondary | ICD-10-CM | POA: Diagnosis not present

## 2023-07-13 DIAGNOSIS — Z7689 Persons encountering health services in other specified circumstances: Secondary | ICD-10-CM

## 2023-07-13 LAB — POCT UA - MICROALBUMIN
Albumin/Creatinine Ratio, Urine, POC: <30
Creatinine, POC: 50 mg/dL
Microalbumin Ur, POC: 10 mg/L

## 2023-07-13 MED ORDER — METFORMIN HCL ER 500 MG PO TB24: 1000.0000 mg | ORAL_TABLET | Freq: Every day | ORAL | 3 refills | Status: DC

## 2023-07-13 NOTE — Progress Notes (Unsigned)
New Patient Office Visit  Subjective:   Sarah Travis 1971/04/22 07/13/2023  Chief Complaint  Patient presents with   New Patient (Initial Visit)    Patient is here to establish with the practice.  States she recently began going to the wellness clinic to try to lose some weight and states that her A1C was checked which showed she was a diabetic.    HPI: Sarah Travis presents today to establish care at [facility] at [facility]. Introduced to Publishing rights manager role and practice setting.  All questions answered.   Last PCP: *** Last annual physical: *** Concerns: See below    DIABETES MELLITUS: Sarah Travis presents for the medical management of diabetes.  Current diabetes medication regimen: Metformin 500mg  XR once daily- she was taking Metformin 500mg  IR with 2 tablets prior but could not tolerate it due to side effects  Patient is  adhering to a diabetic diet.  Patient is exercising regularly with walking.   Patient is *** checking their feet regularly.  Denies polydipsia, polyphagia, polyuria, open wounds or ulcers on feet.  Lab Results  Component Value Date   HGBA1C 6.7 (H) 06/16/2023    No foot exam found No results found for: "LABMICR", "MICROALBUR"  Wt Readings from Last 3 Encounters:  07/13/23 213 lb 9.6 oz (96.9 kg)  07/02/23 211 lb (95.7 kg)  06/16/23 210 lb (95.3 kg)    The following portions of the patient's history were reviewed and updated as appropriate: past medical history, past surgical history, family history, social history, allergies, medications, and problem list.   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   Subclinical hyperthyroidism 01/03/2014   Past Medical History:  Diagnosis Date   Anemia    Diabetes mellitus without complication (HCC)    Enlarged thyroid    High cholesterol    History of iron deficiency anemia    Hyperlipidemia    Thyroid goiter    Thyroid nodule    Multiple   Vitamin D deficiency    Past Surgical History:  Procedure Laterality Date   CESAREAN SECTION     DILATION AND CURETTAGE OF UTERUS     Miscarriage   Family History  Problem Relation Age of Onset   Cancer Mother    Diabetes Mother    Hypertension Mother    Heart failure Father    Hyperlipidemia Father    Social History   Socioeconomic History   Marital status: Married    Spouse name: Not on file   Number of children: Not on file   Years of education: Not on file   Highest education level: Not on file  Occupational History   Not on file  Tobacco Use   Smoking status: Never   Smokeless tobacco: Never  Vaping Use   Vaping status: Never Used  Substance and Sexual Activity   Alcohol use: Never   Drug use: Never   Sexual activity: Not on file  Other Topics Concern  Not on file  Social History Narrative   Not on file   Social Determinants of Health   Financial Resource Strain: Low Risk  (07/13/2023)   Overall Financial Resource Strain (CARDIA)    Difficulty of Paying Living Expenses: Not hard at all  Food Insecurity: No Food Insecurity (07/13/2023)   Hunger Vital Sign    Worried About Running Out of Food in the Last Year: Never true    Ran Out of Food in the Last Year: Never true  Transportation Needs: No Transportation Needs (07/13/2023)   PRAPARE - Administrator, Civil Service (Medical): No    Lack of Transportation (Non-Medical): No  Physical Activity:  Sufficiently Active (07/13/2023)   Exercise Vital Sign    Days of Exercise per Week: 7 days    Minutes of Exercise per Session: 60 min  Stress: No Stress Concern Present (07/13/2023)   [employer] of Occupational Health - Occupational Stress Questionnaire    Feeling of Stress : Only a little  Social Connections: Moderately Isolated (07/13/2023)   Social Connection and Isolation Panel [NHANES]    Frequency of Communication with Friends and Family: More than three times a week    Frequency of Social Gatherings with Friends and Family: Once a week    Attends Religious Services: Never    Database administrator or Organizations: No    Attends Banker Meetings: Never    Marital Status: Married  Catering manager Violence: Not At Risk (07/13/2023)   Humiliation, Afraid, Rape, and Kick questionnaire    Fear of Current or Ex-Partner: No    Emotionally Abused: No    Physically Abused: No    Sexually Abused: No   Outpatient Medications Prior to Visit  Medication Sig Dispense Refill   b complex vitamins capsule Take at least 500 mcg b12 daily     metFORMIN (GLUCOPHAGE-XR) 500 MG 24 hr tablet Take 1 tablet (500 mg total) by mouth daily with breakfast. 30 tablet 0   Vitamin D, Ergocalciferol, (DRISDOL) 1.25 MG (50000 UNIT) CAPS capsule Take 1 capsule (50,000 Units total) by mouth every 7 (seven) days. 4 capsule 0   No facility-administered medications prior to visit.   No Known Allergies  ROS: A complete ROS was performed with pertinent positives/negatives noted in the HPI. The remainder of the ROS are negative.   Objective:   Today's Vitals   07/13/23 1605  BP: 120/70  Pulse: 83  SpO2: 99%  Weight: 213 lb 9.6 oz (96.9 kg)  Height: 5\' 2"  (1.575 m)    GENERAL: Well-appearing, in NAD. Well nourished.  SKIN: Pink, warm and dry. No rash, lesion, ulceration, or ecchymoses.  Head: Normocephalic. NECK: Trachea midline. Full ROM w/o pain or tenderness. No lymphadenopathy.   EARS: Tympanic membranes are intact, translucent without bulging and without drainage. Appropriate landmarks visualized.  EYES: Conjunctiva clear without exudates. EOMI, PERRL, no drainage present.  NOSE: Septum midline w/o deformity. Nares patent, mucosa pink and non-inflamed w/o drainage. No sinus tenderness.  THROAT: Uvula midline. Oropharynx clear. Tonsils non-inflamed without exudate. Mucous membranes pink and moist.  RESPIRATORY: Chest wall symmetrical. Respirations even and non-labored. Breath sounds clear to auscultation bilaterally.  CARDIAC: S1, S2 present, regular rate and rhythm without murmur or gallops. Peripheral pulses 2+ bilaterally.  MSK: Muscle tone and strength appropriate for age. Joints w/o tenderness, redness, or swelling.  EXTREMITIES: Without clubbing, cyanosis, or edema.  NEUROLOGIC: No motor or sensory deficits. Steady, even gait. C2-C12 intact.  PSYCH/MENTAL  STATUS: Alert, oriented x 3. Cooperative, appropriate mood and affect.    Health Maintenance Due  Topic Date Due   FOOT EXAM  Never done   OPHTHALMOLOGY EXAM  Never done   HIV Screening  Never done   Diabetic kidney evaluation - Urine ACR  Never done   Hepatitis C Screening  Never done   DTaP/Tdap/Td (1 - Tdap) Never done   Cervical Cancer Screening (HPV/Pap Cotest)  Never done   Colonoscopy  Never done   Zoster Vaccines- Shingrix (1 of 2) Never done   COVID-19 Vaccine (3 - 2023-24 season) 04/19/2023    No results found for any visits on 07/13/23.     Assessment & Plan:  There are no diagnoses linked to this encounter.    Patient to reach out to office if new, worrisome, or unresolved symptoms arise or if no improvement in patient's condition. Patient verbalized understanding and is agreeable to treatment plan. All questions answered to patient's satisfaction.    No follow-ups on file.    Hilbert Bible, [place]

## 2023-07-13 NOTE — Patient Instructions (Addendum)
Start Omega 3 Fish Oil 1000mg  per day.   Elevated cholesterol - avoid greasy/fatty foods. Adhere to a diet with lean proteins, vegetables, fruits, and low carbohydrates. Drink plenty of water. Regular exercise.

## 2023-07-18 ENCOUNTER — Other Ambulatory Visit (INDEPENDENT_AMBULATORY_CARE_PROVIDER_SITE_OTHER): Payer: Self-pay | Admitting: Family Medicine

## 2023-07-18 DIAGNOSIS — R7303 Prediabetes: Secondary | ICD-10-CM

## 2023-07-18 DIAGNOSIS — Z6841 Body Mass Index (BMI) 40.0 and over, adult: Secondary | ICD-10-CM

## 2023-07-18 DIAGNOSIS — R638 Other symptoms and signs concerning food and fluid intake: Secondary | ICD-10-CM

## 2023-07-18 DIAGNOSIS — E119 Type 2 diabetes mellitus without complications: Secondary | ICD-10-CM

## 2023-07-23 ENCOUNTER — Ambulatory Visit (INDEPENDENT_AMBULATORY_CARE_PROVIDER_SITE_OTHER): Payer: 59 | Admitting: Family Medicine

## 2023-07-23 ENCOUNTER — Encounter (INDEPENDENT_AMBULATORY_CARE_PROVIDER_SITE_OTHER): Payer: Self-pay | Admitting: Family Medicine

## 2023-07-23 VITALS — BP 115/78 | HR 82 | Temp 98.7°F | Ht 60.5 in | Wt 208.0 lb

## 2023-07-23 DIAGNOSIS — E785 Hyperlipidemia, unspecified: Secondary | ICD-10-CM | POA: Diagnosis not present

## 2023-07-23 DIAGNOSIS — E1169 Type 2 diabetes mellitus with other specified complication: Secondary | ICD-10-CM | POA: Diagnosis not present

## 2023-07-23 DIAGNOSIS — Z6839 Body mass index (BMI) 39.0-39.9, adult: Secondary | ICD-10-CM | POA: Diagnosis not present

## 2023-07-23 DIAGNOSIS — Z7984 Long term (current) use of oral hypoglycemic drugs: Secondary | ICD-10-CM | POA: Diagnosis not present

## 2023-07-23 DIAGNOSIS — E559 Vitamin D deficiency, unspecified: Secondary | ICD-10-CM

## 2023-07-23 DIAGNOSIS — Z6841 Body Mass Index (BMI) 40.0 and over, adult: Secondary | ICD-10-CM

## 2023-07-23 MED ORDER — RED YEAST RICE 600 MG PO TABS: 2.0000 | ORAL_TABLET | Freq: Three times a day (TID) | ORAL | Status: DC

## 2023-07-23 MED ORDER — VITAMIN D (ERGOCALCIFEROL) 1.25 MG (50000 UNIT) PO CAPS: 50000.0000 [IU] | ORAL_CAPSULE | ORAL | 0 refills | Status: DC

## 2023-07-23 NOTE — Progress Notes (Signed)
Sarah Travis, D.O.  ABFM, ABOM Specializing in Clinical Bariatric Medicine  Office located at: [address] [place], [place]  [address]   Assessment and Plan:   FOR THE DISEASE OF OBESITY: BMI 40.0-44.9, adult (HCC) Current BMI 39.94 Morbid obesity (HCC)- 42.05- starting BMI 02/11/23 44.2 Assessment & Plan: Since last office visit on 06/19/23 patient's muscle mass has increased by 1 lb. Fat mass has decreased by 4.2 lb. Total body water has decreased by 1.4 lb.  Counseling done on how various foods will affect these numbers and how to maximize success  Total lbs lost to date: 16 lbs  Total weight loss percentage to date: 7.96%    Recommended Dietary Goals Sarah Travis is currently in the action stage of change. As such, her goal is to continue weight management plan.  She has agreed to: continue current plan   Behavioral Intervention We discussed the following today: intermittent fasting strategies, increasing lean protein intake to established goals  Additional resources provided today:  Handout on Health Benefits and Detriments of Intermittent fasting  Evidence-based interventions for health behavior change were utilized today including the discussion of self monitoring techniques, problem-solving barriers and SMART goal setting techniques.   Regarding patient's less desirable eating habits and patterns, we employed the technique of small changes.   Pt will specifically work on: n/a   Recommended Physical Activity Goals Sarah Travis has been advised to work up to 150 minutes of moderate intensity aerobic activity a week and strengthening exercises 2-3 times per week for cardiovascular health, weight loss maintenance and preservation of muscle mass.   She has agreed to : Continue current level of physical activity    Pharmacotherapy We both agreed to : continue current anti-obesity medication regimen   FOR ASSOCIATED CONDITIONS ADDRESSED TODAY:  Hyperlipidemia associated  with type 2 diabetes mellitus (HCC) Assessment & Plan: Most recent Lipid Panel: Lab Results  Component Value Date   CHOL 269 (H) 06/16/2023   HDL 67 06/16/2023   LDLCALC 190 (H) 06/16/2023   TRIG 72 06/16/2023   The 10-year ASCVD risk score (Arnett DK, et al., 2019) is: 2.9%   Values used to calculate the score:     Age: 52 years     Sex: Female     Is Non-Hispanic African American: No     Diabetic: Yes     Tobacco smoker: No     Systolic Blood Pressure: 115 mmHg     Is BP treated: No     HDL Cholesterol: 67 mg/dL     Total Cholesterol: 269 mg/dL  Pt met with PCP on 95/62/13 & was instructed to begin Omega 3 Fish Oil. Per pt, PCP did not recommend statin therapy at this time. I recommend pt to start red yeast rice 2400-3600 mg daily to help reduce her LDL. Continue with fish oil per PCP. Encouraged pt to read about the health benefits of fish oil and red yeast rice. Continue with heart healthy, low-cholesterol meal plan.   Orders: -     Red Yeast Rice; Take 2 tablets (1,200 mg total) by mouth 3 (three) times daily with meals. ( 2400- 3600 mg every day)   Vitamin D deficiency Assessment & Plan: Lab Results  Component Value Date   VD25OH 37.7 06/16/2023   VD25OH 26.57 (L) 01/19/2023   No issues with ERGO 50,000 units once a week - tolerance and compliance good. Continue with weight loss efforts and ERGO at current dose. Will recheck in 2-3  mos.   Orders: -     Vitamin D (Ergocalciferol); Take 1 capsule (50,000 Units total) by mouth every 7 (seven) days.  Dispense: 4 capsule; Refill: 0   Follow up:   Return 08/10/23.  She was informed of the importance of frequent follow up visits to maximize her success with intensive lifestyle modifications for her multiple health conditions.  Subjective:   Chief complaint: Obesity Sarah Travis is here to discuss her progress with her obesity treatment plan. She is on the Category 1 Plan and states she is following her eating plan  approximately 80% of the time. She states she is not exercising.   Interval History:  Sarah Travis is here for a follow up office visit. Since last OV,  Sarah Travis is down 3 lbs. Started new intermittent fasting routine - not for religious purposes. Apart from an apple or orange, she does not eat anything from the morning until 6 pm. After 6 pm, she has 8 ounces of lean protein + a salad. Additionally, she reports feeling warmth and numbness on her right leg - encouraged her to contact PCP about this.   Pharmacotherapy for weight loss: She is currently taking  Metformin 500 mg XR bid .   Review of Systems:  Pertinent positives were addressed with patient today.  Reviewed by clinician on day of visit: allergies, medications, problem list, medical history, surgical history, family history, social history, and previous encounter notes.  Weight Summary and Biometrics   Weight Lost Since Last Visit: 3lb  Weight Gained Since Last Visit: 0lb   Vitals Temp: 98.7 F (37.1 C) BP: 115/78 Pulse Rate: 82 SpO2: 100 %   Anthropometric Measurements Height: 5' 0.5" (1.537 m) Weight: 208 lb (94.3 kg) BMI (Calculated): 39.94 Weight at Last Visit: 211lb Weight Lost Since Last Visit: 3lb Weight Gained Since Last Visit: 0lb Starting Weight: 226lb Total Weight Loss (lbs): 18 lb (8.165 kg) Peak Weight: 237lb   Body Composition  Body Fat %: 48.3 % Fat Mass (lbs): 100.6 lbs Muscle Mass (lbs): 102.2 lbs Total Body Water (lbs): 72.6 lbs Visceral Fat Rating : 15   Other Clinical Data Fasting: no Labs: no Today's Visit #: 9 Starting Date: 02/11/23   Objective:   PHYSICAL EXAM: Blood pressure 115/78, pulse 82, temperature 98.7 F (37.1 C), height 5' 0.5" (1.537 m), weight 208 lb (94.3 kg), SpO2 100%. Body mass index is 39.95 kg/m.  General: she is overweight, cooperative and in no acute distress. PSYCH: Has normal mood, affect and thought process.   HEENT: EOMI, sclerae are  anicteric. Lungs: Normal breathing effort, no conversational dyspnea. Extremities: Moves * 4 Neurologic: A and O * 3, good insight  DIAGNOSTIC DATA REVIEWED: BMET    Component Value Date/Time   NA 141 01/19/2023 1412   K 4.9 01/19/2023 1412   CL 102 01/19/2023 1412   CO2 31 01/19/2023 1412   GLUCOSE 103 (H) 01/19/2023 1412   BUN 15 01/19/2023 1412   CREATININE 0.46 01/19/2023 1412   CALCIUM 9.8 01/19/2023 1412   Lab Results  Component Value Date   HGBA1C 6.7 (H) 06/16/2023   HGBA1C 6.0 (H) 10/18/2010   Lab Results  Component Value Date   INSULIN 6.8 06/16/2023   INSULIN 8.3 02/11/2023   Lab Results  Component Value Date   TSH 0.36 01/19/2023   CBC    Component Value Date/Time   WBC 6.0 01/19/2023 1412   RBC 4.46 01/19/2023 1412   HGB 12.3 01/19/2023 1412   HCT  38.9 01/19/2023 1412   PLT 339.0 01/19/2023 1412   MCV 87.2 01/19/2023 1412   MCHC 31.7 01/19/2023 1412   RDW 13.4 01/19/2023 1412   Iron Studies    Component Value Date/Time   FERRITIN 81 10/18/2010 2026   Lipid Panel     Component Value Date/Time   CHOL 269 (H) 06/16/2023 1135   TRIG 72 06/16/2023 1135   HDL 67 06/16/2023 1135   LDLCALC 190 (H) 06/16/2023 1135   Hepatic Function Panel     Component Value Date/Time   PROT 7.2 01/19/2023 1412   ALBUMIN 4.1 01/19/2023 1412   AST 19 01/19/2023 1412   ALT 15 01/19/2023 1412   ALKPHOS 86 01/19/2023 1412   BILITOT 0.2 01/19/2023 1412      Component Value Date/Time   TSH 0.36 01/19/2023 1412   Nutritional Lab Results  Component Value Date   VD25OH 37.7 06/16/2023   VD25OH 26.57 (L) 01/19/2023    Attestations:   I, Special Puri, acting as a Stage manager for Thomasene Lot, DO., have compiled all relevant documentation for today's office visit on behalf of Thomasene Lot, DO, while in the presence of [employer], DO.  Patient was in the office today and time spent on visit including pre-visit chart review and post-visit  care/coordination of care and electronic medical record documentation was 40 minutes. 50% of the time was in face to face counseling of this patient's medical condition(s) and providing education on treatment options to include the first-line treatment of diet and lifestyle modification.   I have reviewed the above documentation for accuracy and completeness, and I agree with the above. Sarah Travis, D.O.  The 21st Century Cures Act was signed into law in 2016 which includes the topic of electronic health records.  This provides immediate access to information in MyChart.  This includes consultation notes, operative notes, office notes, lab results and pathology reports.  If you have any questions about what you read please let us know at your next visit so we can discuss your concerns and take corrective action if need be.  We are right here with you.

## 2023-07-23 NOTE — Patient Instructions (Signed)
The 10-year ASCVD risk score (Arnett DK, et al., 2019) is: 2.9%   Values used to calculate the score:     Age: 52 years     Sex: Female     Is Non-Hispanic African American: No     Diabetic: Yes     Tobacco smoker: No     Systolic Blood Pressure: 115 mmHg     Is BP treated: No     HDL Cholesterol: 67 mg/dL     Total Cholesterol: 269 mg/dL

## 2023-07-28 ENCOUNTER — Encounter (HOSPITAL_BASED_OUTPATIENT_CLINIC_OR_DEPARTMENT_OTHER): Payer: Self-pay | Admitting: Family Medicine

## 2023-08-04 DIAGNOSIS — Z01419 Encounter for gynecological examination (general) (routine) without abnormal findings: Secondary | ICD-10-CM | POA: Diagnosis not present

## 2023-08-04 DIAGNOSIS — Z1331 Encounter for screening for depression: Secondary | ICD-10-CM | POA: Diagnosis not present

## 2023-08-04 DIAGNOSIS — Z1231 Encounter for screening mammogram for malignant neoplasm of breast: Secondary | ICD-10-CM | POA: Diagnosis not present

## 2023-08-04 LAB — HM MAMMOGRAPHY: HM Mammogram: NORMAL (ref 0–4)

## 2023-08-09 NOTE — Progress Notes (Unsigned)
   SUBJECTIVE:  Chief Complaint: Obesity  Interim History: ***  Sarah Travis is here to discuss her progress with her obesity treatment plan. She is on the {HWW Weight Loss Plan:210964005} and states she {CHL AMB IS/IS NOT:210130109} following her eating plan approximately *** % of the time. She states she {CHL AMB IS/IS NOT:210130109} exercising *** minutes *** times per week.   OBJECTIVE: Visit Diagnoses: Problem List Items Addressed This Visit     Morbid obesity (HCC)   Vitamin D deficiency   Hyperlipidemia associated with type 2 diabetes mellitus (HCC)   Type 2 diabetes mellitus without complication, without long-term current use of insulin (HCC) - Primary    No data recorded No data recorded No data recorded No data recorded   ASSESSMENT AND PLAN:  Diet: Sarah Travis {CHL AMB IS/IS NOT:210130109} currently in the action stage of change. As such, her goal is to {HWW Weight Loss Efforts:210964006}. She {HAS HAS UJW:11914} agreed to {HWW Weight Loss Plan:210964005}.  Exercise: Sarah Travis has been instructed {HWW Exercise:210964007} for weight loss and overall health benefits.   Behavior Modification:  We discussed the following Behavioral Modification Strategies today: {HWW Behavior Modification:210964008}. We discussed various medication options to help Sarah Travis with her weight loss efforts and we both agreed to ***.  No follow-ups on file.Marland Kitchen She was informed of the importance of frequent follow up visits to maximize her success with intensive lifestyle modifications for her multiple health conditions.  Attestation Statements:   Reviewed by clinician on day of visit: allergies, medications, problem list, medical history, surgical history, family history, social history, and previous encounter notes.   Time spent on visit including pre-visit chart review and post-visit care and charting was *** minutes.    Sarah Mccamish, PA-C

## 2023-08-10 ENCOUNTER — Other Ambulatory Visit (INDEPENDENT_AMBULATORY_CARE_PROVIDER_SITE_OTHER): Payer: Self-pay | Admitting: Family Medicine

## 2023-08-10 ENCOUNTER — Ambulatory Visit (INDEPENDENT_AMBULATORY_CARE_PROVIDER_SITE_OTHER): Payer: 59 | Admitting: Physician Assistant

## 2023-08-10 ENCOUNTER — Encounter (HOSPITAL_BASED_OUTPATIENT_CLINIC_OR_DEPARTMENT_OTHER): Payer: Self-pay | Admitting: *Deleted

## 2023-08-10 DIAGNOSIS — E559 Vitamin D deficiency, unspecified: Secondary | ICD-10-CM

## 2023-08-10 DIAGNOSIS — E1169 Type 2 diabetes mellitus with other specified complication: Secondary | ICD-10-CM

## 2023-08-10 DIAGNOSIS — R7303 Prediabetes: Secondary | ICD-10-CM

## 2023-08-10 DIAGNOSIS — E119 Type 2 diabetes mellitus without complications: Secondary | ICD-10-CM

## 2023-08-25 ENCOUNTER — Encounter: Payer: Self-pay | Admitting: Internal Medicine

## 2023-08-25 ENCOUNTER — Ambulatory Visit: Payer: 59 | Admitting: Internal Medicine

## 2023-08-25 VITALS — BP 122/78 | HR 83 | Ht 60.5 in | Wt 213.0 lb

## 2023-08-25 DIAGNOSIS — E042 Nontoxic multinodular goiter: Secondary | ICD-10-CM | POA: Diagnosis not present

## 2023-08-25 NOTE — Progress Notes (Signed)
 Name: Sarah Travis  MRN/ DOB: [id number], Aug 22, 1970    Age/ Sex: 53 y.o., female    PCP: Knute Thersia Bitters, FNP   Reason for Endocrinology Evaluation: MNG     Date of Initial Endocrinology Evaluation: 02/05/2022    HPI: Sarah Travis is a 53 y.o. female with a past medical history of MNG. The patient presented for initial endocrinology clinic visit on 02/05/2022 for consultative assistance with her MNG.   She was diagnosed with multinodular goiter in 2012 based on thyroid  ultrasound.  She also had thyroid  uptake and scan which was normal at 12% uptake but consistent with multinodular goiter, and a left dominant cold nodule. Left  Cold nodule was biopsied in 2015 with benign cytology She is also S/P benign FNA of the left inferior nodule in 2015   She is S/P benign FNA of the left mid and right mid nodule 02/2022  Paternal aunt with thyroid  disease     SUBJECTIVE:   Today (08/25/23): Sarah Travis is here for a follow up on MNG  She met with Dr. Eletha 07/2022, surgery was scheduled for 09/05/2022 but it was canceled, due to insurance issues and a high co-pay of $22,000  She has been following with the [facility] healthy weight and wellness clinic   She has been noted weight loss Denies local neck swelling  Denies palpitations  Denies tremors  Denies diarrhea or constipation Has right LE numbness and burning sensation     HISTORY:  Past Medical History:  Past Medical History:  Diagnosis Date   Anemia    Diabetes mellitus without complication (HCC)    Enlarged thyroid     High cholesterol    History of iron deficiency anemia    Hyperlipidemia    Thyroid  goiter    Thyroid  nodule    Multiple   Vitamin D  deficiency    Past Surgical History:  Past Surgical History:  Procedure Laterality Date   CESAREAN SECTION     DILATION AND CURETTAGE OF UTERUS     Miscarriage    Social History:  reports that she has never smoked. She has never  used smokeless tobacco. She reports that she does not drink alcohol and does not use drugs. Family History: family history includes Cancer in her mother; Diabetes in her mother; Heart failure in her father; Hyperlipidemia in her father; Hypertension in her mother.   HOME MEDICATIONS: Allergies as of 08/25/2023   No Known Allergies      Medication List        Accurate as of August 25, 2023  1:44 PM. If you have any questions, ask your nurse or doctor.          b complex vitamins capsule Take at least 500 mcg b12 daily   metFORMIN  500 MG 24 hr tablet Commonly known as: GLUCOPHAGE -XR Take 2 tablets (1,000 mg total) by mouth daily with breakfast.   Red Yeast Rice 600 MG Tabs Take 2 tablets (1,200 mg total) by mouth 3 (three) times daily with meals. ( 2400- 3600 mg every day)   Vitamin D  (Ergocalciferol ) 1.25 MG (50000 UNIT) Caps capsule Commonly known as: DRISDOL  Take 1 capsule (50,000 Units total) by mouth every 7 (seven) days.          REVIEW OF SYSTEMS: A comprehensive ROS was conducted with the patient and is negative except as per HPI    OBJECTIVE:  VS: BP 122/78 (BP Location: Left Arm, Patient Position: Sitting,  Cuff Size: Normal)   Pulse 83   Ht 5' 0.5 (1.537 m)   Wt 213 lb (96.6 kg)   SpO2 97%   BMI 40.91 kg/m    Wt Readings from Last 3 Encounters:  08/25/23 213 lb (96.6 kg)  07/23/23 208 lb (94.3 kg)  07/13/23 213 lb 9.6 oz (96.9 kg)     EXAM: General: Pt appears well and is in NAD  Neck: General: Supple without adenopathy. Thyroid : Bilateral thyroid  nodules appreciated  Lungs: Clear with good BS bilat   Heart: Auscultation: RRR.  Abdomen: soft, nontender  Extremities:  BL LE: No pretibial edema   Mental Status: Judgment, insight: Intact Orientation: Oriented to time, place, and person Mood and affect: No depression, anxiety, or agitation     DATA REVIEWED:   Latest Reference Range & Units 01/19/23 14:12  Sodium 135 - 145 mEq/L 141   Potassium 3.5 - 5.1 mEq/L 4.9  Chloride 96 - 112 mEq/L 102  CO2 19 - 32 mEq/L 31  Glucose 70 - 99 mg/dL 896 (H)  BUN 6 - 23 mg/dL 15  Creatinine 9.59 - 8.79 mg/dL 9.53  Calcium  8.4 - 10.5 mg/dL 9.8  Alkaline Phosphatase 39 - 117 U/L 86  Albumin 3.5 - 5.2 g/dL 4.1  AST 0 - 37 U/L 19  ALT 0 - 35 U/L 15  Total Protein 6.0 - 8.3 g/dL 7.2  Total Bilirubin 0.2 - 1.2 mg/dL 0.2  GFR >39.99 mL/min 109.24  VITD 30.00 - 100.00 ng/mL 26.57 (L)  Vitamin B12 200 - 1,100 pg/mL 304  WBC 4.0 - 10.5 K/uL 6.0  RBC 3.87 - 5.11 Mil/uL 4.46  Hemoglobin 12.0 - 15.0 g/dL 87.6  HCT 63.9 - 53.9 % 38.9  MCV 78.0 - 100.0 fl 87.2  MCHC 30.0 - 36.0 g/dL 68.2  RDW 88.4 - 84.4 % 13.4  Platelets 150.0 - 400.0 K/uL 339.0  TSH 0.35 - 5.50 uIU/mL 0.36  Triiodothyronine (T3) 76 - 181 ng/dL 899  U5,Qmzz(Ipmzru) 9.39 - 1.60 ng/dL 9.30  (H): Data is abnormally high (L): Data is abnormally low   Thyroid  Ultrasound 02/08/2022  Estimated total number of nodules >/= 1 cm: 6-10   Number of spongiform nodules >/=  2 cm not described below (TR1): 0   Number of mixed cystic and solid nodules >/= 1.5 cm not described below (TR2): 0   _________________________________________________________   Nodule # 1:   Location: RIGHT; Mid   Maximum size: 3.2 cm; Other 2 dimensions: 2.4 x 2.3 cm previously 2.0 x 1.7 x 2.2 cm   Composition: solid/almost completely solid (2)   Echogenicity: isoechoic (1)   Shape: not taller-than-wide (0)   Margins: ill-defined (0)   Echogenic foci: none (0)   ACR TI-RADS total points: 3.   ACR TI-RADS risk category: TR3 (3 points).   ACR TI-RADS recommendations:   **Given size (>/= 2.5 cm) and appearance, fine needle aspiration of this mildly suspicious nodule should be considered based on TI-RADS criteria.   _________________________________________________________   Nodule # 2:   Location: RIGHT; Mid   Maximum size: 1.4 cm; Other 2 dimensions: 1.0 x 1.2 cm  previously 1.5 x 0.9 x 1.6 cm   Composition: solid/almost completely solid (2)   Echogenicity: isoechoic (1)   Shape: not taller-than-wide (0)   Margins: ill-defined (0)   Echogenic foci: none (0)   ACR TI-RADS total points: 3.   ACR TI-RADS risk category: TR3 (3 points).   ACR TI-RADS recommendations:   Given size (<1.4  cm) and appearance, this nodule does NOT meet TI-RADS criteria for biopsy or dedicated follow-up.   _________________________________________________________   Nodule # 3:   Location: RIGHT; Inferior   Maximum size: 2.9 cm; Other 2 dimensions: 2.5 x 2.6 cm previously 2.8 x 2.4 x 2.5 cm   Composition: solid/almost completely solid (2)   Echogenicity: isoechoic (1)   Shape: not taller-than-wide (0)   Margins: ill-defined (0)   Echogenic foci: none (0)   ACR TI-RADS total points: 3.   ACR TI-RADS risk category: TR3 (3 points).   ACR TI-RADS recommendations:   **Given size (>/= 2.5 cm) and appearance, fine needle aspiration of this mildly suspicious nodule should be considered based on TI-RADS criteria.   _________________________________________________________   Nodule # 4:   Location: LEFT; Mid   Maximum size: 2.4 cm; Other 2 dimensions: 2.1 x 2 1 cm previously 3.8 x 1.9 x 3.9 cm   No aggressive features on today's evaluation. This nodule was previously biopsied in 03/08/2014.   Assuming a benign pathologic diagnosis, repeat sampling and/or dedicated follow-up is not recommended.   _________________________________________________________   Nodule # 5:   Location: LEFT; Mid   Maximum size: 4.1 cm; Other 2 dimensions: 2.8 x 3.2 cm previously 3.7 x 2.8 x 3.4 cm   No aggressive features on today's evaluation. This nodule was previously biopsied in 03/08/2014.   Assuming a benign pathologic diagnosis, repeat sampling and/or dedicated follow-up is not recommended.   _________________________________________________________    Nodule # 6:   Location: LEFT; Mid   Maximum size: 1.4 cm; Other 2 dimensions: 1.2 x 1.2 cm previously 1.6 x 1.3 x 1.4 cm   Composition: solid/almost completely solid (2)   Echogenicity: isoechoic (1)   Shape: not taller-than-wide (0)   Margins: ill-defined (0)   Echogenic foci: macrocalcifications (1)   ACR TI-RADS total points: 4.   ACR TI-RADS risk category: TR4 (4-6 points).   ACR TI-RADS recommendations:   **Given size (>/= 1.0 cm) and appearance, fine needle aspiration of this highly suspicious nodule should be considered based on TI-RADS criteria.   _________________________________________________________   No cervical adenopathy or abnormal fluid collection within the imaged neck.   IMPRESSION: 1. Enlarged multinodular thyroid . 2. 3.2 cm RIGHT mid TR-3 (#1), 2.9 cm RIGHT inferior TR-3 (#3) and 1.4 cm LEFT mid TR-4 (#6) thyroid  nodules. FNA biopsy of these highly suspicious nodules should be considered based on TI-RADS criteria. 3. Nodules labeled #4 and #5 on today's evaluation were previously biopsied in 03/08/2014. Assuming a benign pathologic diagnosis, repeat sampling and/or dedicated follow-up is not recommended.    02/16/2014 Uptake and Scan: 24 hour radioiodine uptake calculated at 12%, normal.  Images of the thyroid  gland demonstrate a multinodular appearance of both lobes with multiple warm and cold nodules. No dominant hot nodules.  Dominant cold nodule at mid to upper pole of LEFT lobe corresponding to the largest nodule on ultrasound, though this nodule is stable in size from an earlier ultrasound from 2012.  IMPRESSION: Multinodular thyroid  gland as above.    FNA left mid nodule 03/08/2014  Diagnosis THYROID , FINE NEEDLE ASPIRATION, LEFT MID (SPECIMEN 2 OF 2, COLLECTED ON 03/08/14): BENIGN. FINDINGS CONSISTENT WITH NON-NEOPLASTIC GOITER.    FNA Left inferior 03/08/2014 THYROID , FINE NEEDLE ASPIRATION, LEFT INFERIOR (SPECIMEN 1  OF 2, COLLECTED ON 03/08/14): BENIGN. FINDINGS CONSISTENT WITH NON-NEOPLASTIC GOITER.    FNA Left mid nodule 1.4 cm 02/26/2022  Clinical History: None provided  Specimen Submitted:  B. THYROID , LEFT MID, FINE  NEEDLE ASPIRATION:    FINAL MICROSCOPIC DIAGNOSIS:  - Consistent with benign follicular nodule (Bethesda category II)   FNA Right Mid nodule 3.2 cm 02/26/2022   Clinical History: None provided Specimen Submitted:  A. THYROID , RIGHT MID, FINE NEEDLE ASPIRATION:   FINAL MICROSCOPIC DIAGNOSIS: - Consistent with benign follicular nodule (Bethesda category II)     ASSESSMENT/PLAN/RECOMMENDATIONS:   MNG:   -No local neck symptoms, she actually has noted decrease in local neck size this is attributed to recent weight loss -Patient was  interested in surgical intervention, unfortunately due to insurance issues she has not been able to proceed with total thyroidectomy. -We had discussed treatment options to include total thyroidectomy versus radiofrequency ablation but the patient opted with total thyroidectomy - She is S/P benign FNA of the left mid and left inferior nodules in 2015 - She is S/P benign FNA of the left mid and right mid nodule 02/2022 -Will proceed with thyroid  ultrasound to monitor for stability        Signed electronically by: Stefano Redgie Butts, MD  [facility] Endocrinology  [facility] [address] [place] [place], [place] [address] Phone: [phone] FAX: [phone]   CC: Knute Thersia Bitters, FNP [address] [place] [address] Phone: [phone] Fax: [phone]   Return to Endocrinology clinic as below: Future Appointments  Date Time Provider Department Center  09/02/2023 11:20 AM Midge Sober, DO [facility] None  10/19/2023  9:10 AM Caudle, Thersia Bitters, FNP [facility]

## 2023-09-02 ENCOUNTER — Ambulatory Visit (INDEPENDENT_AMBULATORY_CARE_PROVIDER_SITE_OTHER): Payer: 59 | Admitting: Family Medicine

## 2023-09-02 ENCOUNTER — Encounter (INDEPENDENT_AMBULATORY_CARE_PROVIDER_SITE_OTHER): Payer: Self-pay | Admitting: Family Medicine

## 2023-09-02 VITALS — BP 120/79 | HR 79 | Temp 98.6°F | Ht 60.5 in | Wt 211.0 lb

## 2023-09-02 DIAGNOSIS — Z6841 Body Mass Index (BMI) 40.0 and over, adult: Secondary | ICD-10-CM

## 2023-09-02 DIAGNOSIS — E559 Vitamin D deficiency, unspecified: Secondary | ICD-10-CM

## 2023-09-02 DIAGNOSIS — E042 Nontoxic multinodular goiter: Secondary | ICD-10-CM | POA: Diagnosis not present

## 2023-09-02 DIAGNOSIS — E1169 Type 2 diabetes mellitus with other specified complication: Secondary | ICD-10-CM

## 2023-09-02 MED ORDER — VITAMIN D (ERGOCALCIFEROL) 1.25 MG (50000 UNIT) PO CAPS
50000.0000 [IU] | ORAL_CAPSULE | ORAL | 1 refills | Status: DC
Start: 2023-09-02 — End: 2023-11-04

## 2023-09-02 NOTE — Progress Notes (Signed)
Carlye Grippe, D.O.  ABFM, ABOM Specializing in Clinical Bariatric Medicine  Office located at: [address] [place], [place]  [address]   Assessment and Plan:   FOR THE DISEASE OF OBESITY: BMI 40.0-44.9, adult (HCC) Current BMI 40.51 Morbid obesity (HCC)- 42.05- starting BMI 02/11/23 44.2 Assessment & Plan: Since last office visit on 07/23/23 patient's muscle mass has increased by 2.4 lb. Fat mass has increased by 0.4 lb. Total body water has decreased by 0.4 lb.  Counseling done on how various foods will affect these numbers and how to maximize success  Total lbs lost to date: 15 lbs  Total weight loss percentage to date: 6.64%    Recommended Dietary Goals Sarah Travis is currently in the action stage of change. As such, her goal is to continue weight management plan.  She has agreed to: continue current plan   Behavioral Intervention We discussed the following today: continue to work on maintaining a reduced calorie state, getting the recommended amount of protein, incorporating whole foods, making healthy choices, staying well hydrated and practicing mindfulness when eating.  Additional resources provided today: None  Evidence-based interventions for health behavior change were utilized today including the discussion of self monitoring techniques, problem-solving barriers and SMART goal setting techniques.   Regarding patient's less desirable eating habits and patterns, we employed the technique of small changes.   Pt will specifically work on: increasing lean protein intake   Recommended Physical Activity Goals Sarah Travis has been advised to work up to 150 minutes of moderate intensity aerobic activity a week and strengthening exercises 2-3 times per week for cardiovascular health, weight loss maintenance and preservation of muscle mass.   She has agreed to : increase walking to 60 minutes, 5 days a week.    Pharmacotherapy We both agreed to : continue current  anti-obesity medication regimen   FOR ASSOCIATED CONDITIONS ADDRESSED TODAY:  Multiple thyroid nodules Assessment & Plan: Reviewed most recent endocrinology OV note. Pt with hx of multinodular goiter diagnosed in 2012. She has had several workups and procedures in the past. Pt was interested in surgical intervention with [facility], however opted not to do it because of cost. Pt will instead be proceeding with a thyroid ultrasound. Continue to f/up with endocrinology as directed.    Vitamin D deficiency Assessment & Plan: She reports good compliance and tolerance with ERGO 50,000 units every 7 days. Continue current regimen and weight loss efforts. Will refill med.   Orders: -     Vitamin D (Ergocalciferol); Take 1 capsule (50,000 Units total) by mouth every 7 (seven) days.  Dispense: 4 capsule; Refill: 1   Follow up:   Return 09/30/23. She was informed of the importance of frequent follow up visits to maximize her success with intensive lifestyle modifications for her multiple health conditions.  Subjective:   Chief complaint: Obesity Sarah Travis is here to discuss her progress with her obesity treatment plan. She is on the Category 1 Plan and states she is following her eating plan approximately  (Pt unanswered)% of the time. She states she is walking 60 minutes 4  days per week.  Interval History:  Sarah Travis is here for a follow up office visit. Since last OV on 07/23/23, pt is up 3 lbs. Over the holiday period, pt endorses eating some foods rich in carbohydrates and fats. Overall, she has been more protein focused and is cutting out the simple carbs. She has increased her walking to 60 minutes, 4 days a  week. She started the Red Yeast Rice.   Pharmacotherapy for weight loss: She is currently taking  Metformin XR 500 mg two tablets daily .   Review of Systems:  Pertinent positives were addressed with patient today.  Reviewed by clinician on day of visit: allergies, medications,  problem list, medical history, surgical history, family history, social history, and previous encounter notes.  Weight Summary and Biometrics   Weight Lost Since Last Visit: 0  Weight Gained Since Last Visit: 3 lb   Vitals Temp: 98.6 F (37 C) BP: 120/79 Pulse Rate: 79 SpO2: 99 %   Anthropometric Measurements Height: 5' 0.5" (1.537 m) Weight: 211 lb (95.7 kg) BMI (Calculated): 40.51 Weight at Last Visit: 208 lb Weight Lost Since Last Visit: 0 Weight Gained Since Last Visit: 3 lb Starting Weight: 226 lb Total Weight Loss (lbs): 15 lb (6.804 kg) Peak Weight: 237 lb   Body Composition  Body Fat %: 47.8 % Fat Mass (lbs): 101 lbs Muscle Mass (lbs): 104.6 lbs Total Body Water (lbs): 72.2 lbs Visceral Fat Rating : 15   Other Clinical Data Fasting: no Labs: no Today's Visit #: 10 Starting Date: 02/11/23   Objective:   PHYSICAL EXAM: Blood pressure 120/79, pulse 79, temperature 98.6 F (37 C), height 5' 0.5" (1.537 m), weight 211 lb (95.7 kg), SpO2 99%. Body mass index is 40.53 kg/m.  General: she is overweight, cooperative and in no acute distress. PSYCH: Has normal mood, affect and thought process.   HEENT: EOMI, sclerae are anicteric. Lungs: Normal breathing effort, no conversational dyspnea. Extremities: Moves * 4 Neurologic: A and O * 3, good insight  DIAGNOSTIC DATA REVIEWED: BMET    Component Value Date/Time   NA 141 01/19/2023 1412   K 4.9 01/19/2023 1412   CL 102 01/19/2023 1412   CO2 31 01/19/2023 1412   GLUCOSE 103 (H) 01/19/2023 1412   BUN 15 01/19/2023 1412   CREATININE 0.46 01/19/2023 1412   CALCIUM 9.8 01/19/2023 1412   Lab Results  Component Value Date   HGBA1C 6.7 (H) 06/16/2023   HGBA1C 6.0 (H) 10/18/2010   Lab Results  Component Value Date   INSULIN 6.8 06/16/2023   INSULIN 8.3 02/11/2023   Lab Results  Component Value Date   TSH 0.36 01/19/2023   CBC    Component Value Date/Time   WBC 6.0 01/19/2023 1412   RBC 4.46  01/19/2023 1412   HGB 12.3 01/19/2023 1412   HCT 38.9 01/19/2023 1412   PLT 339.0 01/19/2023 1412   MCV 87.2 01/19/2023 1412   MCHC 31.7 01/19/2023 1412   RDW 13.4 01/19/2023 1412   Iron Studies    Component Value Date/Time   FERRITIN 81 10/18/2010 2026   Lipid Panel     Component Value Date/Time   CHOL 269 (H) 06/16/2023 1135   TRIG 72 06/16/2023 1135   HDL 67 06/16/2023 1135   LDLCALC 190 (H) 06/16/2023 1135   Hepatic Function Panel     Component Value Date/Time   PROT 7.2 01/19/2023 1412   ALBUMIN 4.1 01/19/2023 1412   AST 19 01/19/2023 1412   ALT 15 01/19/2023 1412   ALKPHOS 86 01/19/2023 1412   BILITOT 0.2 01/19/2023 1412      Component Value Date/Time   TSH 0.36 01/19/2023 1412   Nutritional Lab Results  Component Value Date   VD25OH 37.7 06/16/2023   VD25OH 26.57 (L) 01/19/2023    Attestations:   I, Sarah Travis, acting as a Stage manager for [employer]  Sarah Bramble, DO., have compiled all relevant documentation for today's office visit on behalf of Sarah Lot, DO, while in the presence of [employer], DO.  I have reviewed the above documentation for accuracy and completeness, and I agree with the above. Carlye Grippe, D.O.  The 21st Century Cures Act was signed into law in 2016 which includes the topic of electronic health records.  This provides immediate access to information in MyChart.  This includes consultation notes, operative notes, office notes, lab results and pathology reports.  If you have any questions about what you read please let us know at your next visit so we can discuss your concerns and take corrective action if need be.  We are right here with you.

## 2023-09-08 ENCOUNTER — Other Ambulatory Visit: Payer: 59

## 2023-09-16 ENCOUNTER — Ambulatory Visit
Admission: RE | Admit: 2023-09-16 | Discharge: 2023-09-16 | Disposition: A | Discharge status: Home / Self Care | Payer: 59 | Source: Ambulatory Visit | Attending: Internal Medicine | Admitting: Internal Medicine

## 2023-09-16 DIAGNOSIS — E042 Nontoxic multinodular goiter: Secondary | ICD-10-CM

## 2023-09-22 ENCOUNTER — Telehealth: Payer: Self-pay | Admitting: Internal Medicine

## 2023-09-22 DIAGNOSIS — E042 Nontoxic multinodular goiter: Secondary | ICD-10-CM

## 2023-09-22 NOTE — Telephone Encounter (Signed)
Patient would like to move forward with the biopsy.

## 2023-09-22 NOTE — Telephone Encounter (Signed)
 Can you please let the patient know that her thyroid  ultrasound shows some of the nodules have changed in size and there is a new thyroid  nodule that needs to be biopsied.    This has not been biopsied in the past  If she asks ,it is at the right lower area of the thyroid   Please take permission from the patient to proceed with a biopsy order.  She has had them in the past so we will be the same process   Thanks

## 2023-09-30 ENCOUNTER — Ambulatory Visit (INDEPENDENT_AMBULATORY_CARE_PROVIDER_SITE_OTHER): Payer: 59 | Admitting: Family Medicine

## 2023-10-13 ENCOUNTER — Ambulatory Visit (INDEPENDENT_AMBULATORY_CARE_PROVIDER_SITE_OTHER): Payer: Medicaid Other | Admitting: Family Medicine

## 2023-10-19 ENCOUNTER — Ambulatory Visit (HOSPITAL_BASED_OUTPATIENT_CLINIC_OR_DEPARTMENT_OTHER): Payer: 59 | Admitting: Family Medicine

## 2023-10-26 ENCOUNTER — Inpatient Hospital Stay: Admission: RE | Admit: 2023-10-26 | Payer: 59 | Source: Ambulatory Visit

## 2023-10-29 ENCOUNTER — Encounter (HOSPITAL_BASED_OUTPATIENT_CLINIC_OR_DEPARTMENT_OTHER): Payer: Self-pay | Admitting: Family Medicine

## 2023-10-29 ENCOUNTER — Ambulatory Visit (INDEPENDENT_AMBULATORY_CARE_PROVIDER_SITE_OTHER): Payer: Self-pay | Admitting: Family Medicine

## 2023-10-29 VITALS — BP 126/75 | HR 79 | Ht 60.5 in | Wt 215.0 lb

## 2023-10-29 DIAGNOSIS — E1169 Type 2 diabetes mellitus with other specified complication: Secondary | ICD-10-CM | POA: Diagnosis not present

## 2023-10-29 DIAGNOSIS — E119 Type 2 diabetes mellitus without complications: Secondary | ICD-10-CM | POA: Diagnosis not present

## 2023-10-29 DIAGNOSIS — Z1211 Encounter for screening for malignant neoplasm of colon: Secondary | ICD-10-CM

## 2023-10-29 DIAGNOSIS — E785 Hyperlipidemia, unspecified: Secondary | ICD-10-CM | POA: Diagnosis not present

## 2023-10-29 DIAGNOSIS — M5431 Sciatica, right side: Secondary | ICD-10-CM | POA: Diagnosis not present

## 2023-10-29 LAB — LIPID PANEL
Chol/HDL Ratio: 4.3 ratio (ref 0.0–4.4)
Cholesterol, Total: 317 mg/dL — ABNORMAL HIGH (ref 100–199)
HDL: 73 mg/dL (ref >39)
LDL Chol Calc (NIH): 228 mg/dL — ABNORMAL HIGH (ref 0–99)
Triglycerides: 97 mg/dL (ref 0–149)
VLDL Cholesterol Cal: 16 mg/dL (ref 5–40)

## 2023-10-29 LAB — BASIC METABOLIC PANEL
BUN/Creatinine Ratio: 34 — ABNORMAL HIGH (ref 9–23)
BUN: 17 mg/dL (ref 6–24)
CO2: 23 mmol/L (ref 20–29)
Calcium: 9.7 mg/dL (ref 8.7–10.2)
Chloride: 102 mmol/L (ref 96–106)
Creatinine, Ser: 0.5 mg/dL — ABNORMAL LOW (ref 0.57–1.00)
Glucose: 104 mg/dL — ABNORMAL HIGH (ref 70–99)
Potassium: 4.7 mmol/L (ref 3.5–5.2)
Sodium: 140 mmol/L (ref 134–144)
eGFR: 111 mL/min/{1.73_m2} (ref >59)

## 2023-10-29 LAB — HEMOGLOBIN A1C
Est. average glucose Bld gHb Est-mCnc: 128 mg/dL
Hgb A1c MFr Bld: 6.1 % — ABNORMAL HIGH (ref 4.8–5.6)

## 2023-10-29 MED ORDER — TRIAMCINOLONE ACETONIDE 40 MG/ML IJ SUSP
40.0000 mg | Freq: Once | INTRAMUSCULAR | Status: AC
  Administered 2023-10-29: 40 mg via INTRAMUSCULAR

## 2023-10-29 NOTE — Patient Instructions (Addendum)
 Please use Ice therapy to right lower back. Use Ibuprofen or Naproxen as needed for pain. If no improvement, reach out to PCP.    NASAL /RHINITIS:  Flonase Spray Nasacort Spray    Follow up with Endocrinology regarding thyroid nodules as scheduled.

## 2023-10-29 NOTE — Progress Notes (Signed)
 Subjective:   Sarah Travis 01/12/1971 10/29/2023  Chief Complaint  Patient presents with   Medical Management of Chronic Issues    Pt has concerns about pain in her back and also states she sometimes has some numbness on right leg and in feet.    HPI: Sarah Travis is a 53 year old female with history of multinodular goiter, diabetes, hyperlipidemia, presents today for re-assessment and management of chronic medical conditions.  DIABETES MELLITUS: Sarah Travis presents for the medical management of diabetes.  Current diabetes medication regimen: Metformin 1000mg  every day  Patient is  adhering to a diabetic diet. She is seeing [facility].  Patient is  exercising regularly.  Patient is not checking BS regularly.  Patient is  checking their feet regularly.  Denies polydipsia, polyphagia, polyuria, open wounds or ulcers on feet.   Lab Results  Component Value Date   HGBA1C 6.7 (H) 06/16/2023    Foot Exam: 07/13/2023 Lab Results  Component Value Date   MICROALBUR 10 07/13/2023    Wt Readings from Last 3 Encounters:  10/29/23 215 lb (97.5 kg)  09/02/23 211 lb (95.7 kg)  08/25/23 213 lb (96.6 kg)   HYPERLIPIDEMIA: Sarah Travis presents for the medical management of hyperlipidemia.  Patient's current HLD regimen is: Red Yeast Rice Capsule  Patient is not currently taking prescribed medications for HLD.  Adhering to heathy diet: Yes Exercising regularly: Yes  Lab Results  Component Value Date   CHOL 269 (H) 06/16/2023   HDL 67 06/16/2023   LDLCALC 190 (H) 06/16/2023   TRIG 72 06/16/2023     BACK PAIN:   Sarah Travis presents with complaint of back pain.   Onset: 5 days Location: Lower back, radiating from right lower to left lower back.  Patient reports pain increasing with movement of lower legs.  She states numbness and pain radiates from the right lower back down to the right leg.  Reports numbness  and pain are intermittent. Recent Injury: None Character: Sore, Aching  Aggravating Factors: Lying flat, movement  Alleviating Factors: None  ASSOCIATED SYMPTOMS:  Chills: no Dysuria: no Abdominal Pain: no Nausea: no Vomiting: no Hematuria: no Dizziness: no Bowel or Bladder Dysfunction: no Numbness: Yes,   Weakness: no    The following portions of the patient's history were reviewed and updated as appropriate: past medical history, past surgical history, family history, social history, allergies, medications, and problem list.   Patient Active Problem List   Diagnosis Date Noted   Sciatica of right side 10/29/2023   Hyperlipidemia associated with type 2 diabetes mellitus (HCC) 07/13/2023   Type 2 diabetes mellitus without complication, without long-term current use of insulin (HCC) 07/13/2023   Abnormal craving 06/16/2023   BMI 40.0-44.9, adult (HCC) Current BMI 40.6 04/14/2023   Vitamin D deficiency 03/03/2023   Morbid obesity (HCC) 01/19/2023   Multinodular goiter 01/19/2023   Enlarged thyroid gland 08/05/2022   Multiple thyroid nodules 08/05/2022   Toxic multinodular goiter w/o crisis 06/30/2014   Subclinical hyperthyroidism 01/03/2014   Past Medical History:  Diagnosis Date   Anemia    B12 deficiency 04/14/2023   Diabetes mellitus without complication (HCC)    Enlarged thyroid    High cholesterol    History of iron deficiency anemia    Hyperlipidemia    Sleep deprivation 04/14/2023   Thyroid goiter    Thyroid nodule    Multiple   Vitamin D deficiency    Past Surgical History:  Procedure Laterality Date   CESAREAN SECTION     DILATION AND CURETTAGE OF UTERUS     Miscarriage   Family History  Problem Relation Age of Onset   Cancer Mother    Diabetes Mother    Hypertension Mother    Heart failure Father    Hyperlipidemia Father    Outpatient Medications Prior to Visit  Medication Sig Dispense Refill   b complex vitamins capsule Take at least 500  mcg b12 daily     metFORMIN (GLUCOPHAGE-XR) 500 MG 24 hr tablet Take 2 tablets (1,000 mg total) by mouth daily with breakfast. 180 tablet 3   Red Yeast Rice 600 MG TABS Take 2 tablets (1,200 mg total) by mouth 3 (three) times daily with meals. ( 2400- 3600 mg every day)     Vitamin D, Ergocalciferol, (DRISDOL) 1.25 MG (50000 UNIT) CAPS capsule Take 1 capsule (50,000 Units total) by mouth every 7 (seven) days. 4 capsule 1   No facility-administered medications prior to visit.   No Known Allergies   ROS: A complete ROS was performed with pertinent positives/negatives noted in the HPI. The remainder of the ROS are negative.    Objective:   Today's Vitals   10/29/23 0855  BP: 126/75  Pulse: 79  SpO2: 100%  Weight: 215 lb (97.5 kg)  Height: 5' 0.5" (1.537 m)    Physical Exam          GENERAL: Well-appearing, in NAD. Well nourished.  SKIN: Pink, warm and dry.   Head: Normocephalic. NECK: Trachea midline. Full ROM w/o pain or tenderness.  RESPIRATORY: Chest wall symmetrical. Respirations even and non-labored. Breath sounds clear to auscultation bilaterally.  CARDIAC: S1, S2 present, regular rate and rhythm without murmur or gallops. Peripheral pulses 2+ bilaterally.  MSK: Muscle tone and strength appropriate for age. Joints w/o tenderness, redness, or swelling.  Positive straight leg raise test right side.  No crepitus, step-off or deformity to spine.  Full range of motion present to spine. EXTREMITIES: Without clubbing, cyanosis, or edema.  NEUROLOGIC: No motor or sensory deficits. Steady, even gait. C2-C12 intact.  PSYCH/MENTAL STATUS: Alert, oriented x 3. Cooperative, appropriate mood and affect.      Assessment & Plan:  1. Type 2 diabetes mellitus without complication, without long-term current use of insulin (HCC) (Primary) Currently controlled with metformin and dietary changes.  Referral placed for patient to complete her ophthalmology exam.  Will obtain A1c and BMP with lab  work today. - Ambulatory referral to Ophthalmology - Hemoglobin A1c - Basic Metabolic Panel (BMET)  2. Hyperlipidemia associated with type 2 diabetes mellitus (HCC) Patient has been trying to control with red yeast capsule and dietary changes.  Will assess with lipid panel today with lab work - Lipid panel  3. Sciatica of right side New issue, discussed likely sciatica of right lower back contributing to pain and numbness of right lower extremity.  Will give Kenalog 40 mg IM in office today, recommend NSAID therapy, ice and stretching provided.  If no improvement in 1 to 2 weeks reach out to PCP - triamcinolone acetonide (KENALOG-40) injection 40 mg  4. Screening for colon cancer Cologuard previously ordered in November 2024 for patient.  She would like to still complete.  New order placed - Cologuard   Meds ordered this encounter  Medications   triamcinolone acetonide (KENALOG-40) injection 40 mg   Lab Orders         Cologuard         Hemoglobin  A1c         Lipid panel         Basic Metabolic Panel (BMET)     No images are attached to the encounter or orders placed in the encounter.  Return in about 6 months (around 04/30/2024) for DIABETES CHECK UP, hyperlipidemia.    Patient to reach out to office if new, worrisome, or unresolved symptoms arise or if no improvement in patient's condition. Patient verbalized understanding and is agreeable to treatment plan. All questions answered to patient's satisfaction.    Hilbert Bible, [place]

## 2023-10-31 NOTE — Progress Notes (Signed)
 Please notify pt: A1C is well controlled. Continue on Metformin.  Cholesterol is uncontrolled and has worsened from 4 months ago. Recommend starting statin therapy. If agreeable, let me know and I will send in. Pt will need a fasting lab appt in 2-3 months if starting statin.

## 2023-11-04 ENCOUNTER — Other Ambulatory Visit (HOSPITAL_BASED_OUTPATIENT_CLINIC_OR_DEPARTMENT_OTHER): Payer: Self-pay | Admitting: Family Medicine

## 2023-11-04 DIAGNOSIS — E559 Vitamin D deficiency, unspecified: Secondary | ICD-10-CM

## 2023-11-04 DIAGNOSIS — E1169 Type 2 diabetes mellitus with other specified complication: Secondary | ICD-10-CM

## 2023-11-04 MED ORDER — VITAMIN D (ERGOCALCIFEROL) 1.25 MG (50000 UNIT) PO CAPS: 50000.0000 [IU] | ORAL_CAPSULE | ORAL | 1 refills | Status: AC

## 2023-11-04 MED ORDER — ATORVASTATIN CALCIUM 40 MG PO TABS: 40.0000 mg | ORAL_TABLET | Freq: Every evening | ORAL | 1 refills | Status: DC

## 2023-11-18 ENCOUNTER — Ambulatory Visit (INDEPENDENT_AMBULATORY_CARE_PROVIDER_SITE_OTHER): Payer: Medicaid Other | Admitting: Family Medicine

## 2023-11-27 ENCOUNTER — Inpatient Hospital Stay: Admission: RE | Admit: 2023-11-27 | Source: Ambulatory Visit

## 2023-11-27 ENCOUNTER — Telehealth: Payer: Self-pay

## 2023-11-27 NOTE — Telephone Encounter (Signed)
 Patient canceled [place] due to change in insurance. She only has [employer] .

## 2024-01-04 ENCOUNTER — Other Ambulatory Visit (HOSPITAL_BASED_OUTPATIENT_CLINIC_OR_DEPARTMENT_OTHER)

## 2024-05-02 ENCOUNTER — Ambulatory Visit (HOSPITAL_BASED_OUTPATIENT_CLINIC_OR_DEPARTMENT_OTHER): Admitting: Family Medicine

## 2024-05-12 ENCOUNTER — Other Ambulatory Visit (HOSPITAL_BASED_OUTPATIENT_CLINIC_OR_DEPARTMENT_OTHER): Payer: Self-pay | Admitting: Family Medicine

## 2024-05-12 DIAGNOSIS — E1169 Type 2 diabetes mellitus with other specified complication: Secondary | ICD-10-CM

## 2024-06-01 ENCOUNTER — Ambulatory Visit (HOSPITAL_BASED_OUTPATIENT_CLINIC_OR_DEPARTMENT_OTHER): Admitting: Family Medicine

## 2024-06-30 ENCOUNTER — Encounter (HOSPITAL_BASED_OUTPATIENT_CLINIC_OR_DEPARTMENT_OTHER): Payer: Self-pay | Admitting: Family Medicine

## 2024-06-30 ENCOUNTER — Ambulatory Visit (INDEPENDENT_AMBULATORY_CARE_PROVIDER_SITE_OTHER): Admitting: Family Medicine

## 2024-06-30 VITALS — BP 130/76 | HR 91 | Ht 60.5 in | Wt 211.0 lb

## 2024-06-30 DIAGNOSIS — Z1231 Encounter for screening mammogram for malignant neoplasm of breast: Secondary | ICD-10-CM

## 2024-06-30 DIAGNOSIS — M5431 Sciatica, right side: Secondary | ICD-10-CM

## 2024-06-30 DIAGNOSIS — R4189 Other symptoms and signs involving cognitive functions and awareness: Secondary | ICD-10-CM | POA: Diagnosis not present

## 2024-06-30 DIAGNOSIS — E785 Hyperlipidemia, unspecified: Secondary | ICD-10-CM

## 2024-06-30 DIAGNOSIS — E119 Type 2 diabetes mellitus without complications: Secondary | ICD-10-CM | POA: Diagnosis not present

## 2024-06-30 DIAGNOSIS — E1169 Type 2 diabetes mellitus with other specified complication: Secondary | ICD-10-CM | POA: Diagnosis not present

## 2024-06-30 DIAGNOSIS — Z1211 Encounter for screening for malignant neoplasm of colon: Secondary | ICD-10-CM

## 2024-06-30 MED ORDER — TRIAMCINOLONE ACETONIDE 40 MG/ML IJ SUSP
40.0000 mg | Freq: Once | INTRAMUSCULAR | Status: AC
  Administered 2024-06-30: 40 mg via INTRAMUSCULAR

## 2024-06-30 NOTE — Progress Notes (Signed)
 Subjective:   Sarah Travis 1971-02-08 06/30/2024  Chief Complaint  Patient presents with   Medical Management of Chronic Issues    Pt is here today for a follow up. States has been taking the metformin  that was prescribed at last OV and states at times she will feel foggyheaded and also states she is still having pain in her right leg. Also is asking for labwork to be done.    HPI: Sarah Travis presents today for re-assessment and management of chronic medical conditions.  DIABETES MELLITUS: Sarah Travis presents for the medical management of diabetes.  Current diabetes medication regimen: Metformin  1000mg  daily Patient is adhering to a diabetic diet.  Patient is not exercising regularly.  Patient is not checking BS regularly.  Patient is checking their feet regularly.  Denies polydipsia, polyphagia, polyuria, open wounds or ulcers on feet.  Pt states she feels brain fog sometimes after taking Metformin .   Lab Results  Component Value Date   HGBA1C 6.1 (H) 10/29/2023    Foot Exam: 06/30/2024 Lab Results  Component Value Date   MICROALBUR 10 07/13/2023    Wt Readings from Last 3 Encounters:  06/30/24 211 lb (95.7 kg)  10/29/23 215 lb (97.5 kg)  09/02/23 211 lb (95.7 kg)    HYPERLIPIDEMIA: Sarah Travis presents for the medical management of hyperlipidemia.  Patient's current HLD regimen is: Pt states she is taking pravastatin, however is currently prescribed atorvastatin . She is unsure of which one she is taking but is taking a statin.  Patient is currently taking prescribed medications for HLD.  Adhering to heathy diet: Yes Exercising regularly: No  RIGHT LEG PAIN:  Pt states her RLE pain has returned since previous visit during March 2025. She denies trauma or injury. Walking and sitting worsens pain. She has not taken any OTC meds for sx, has not tried ice/heat, and has not tried any stretches. She denies back pain.    The  following portions of the patient's history were reviewed and updated as appropriate: past medical history, past surgical history, family history, social history, allergies, medications, and problem list.   Patient Active Problem List   Diagnosis Date Noted   Sciatica of right side 10/29/2023   Hyperlipidemia associated with type 2 diabetes mellitus (HCC) 07/13/2023   Type 2 diabetes mellitus without complication, without long-term current use of insulin  (HCC) 07/13/2023   Abnormal craving 06/16/2023   BMI 40.0-44.9, adult (HCC) Current BMI 40.6 04/14/2023   Vitamin D  deficiency 03/03/2023   Morbid obesity (HCC) 01/19/2023   Multinodular goiter 01/19/2023   Enlarged thyroid  gland 08/05/2022   Multiple thyroid  nodules 08/05/2022   Toxic multinodular goiter w/o crisis 06/30/2014   Subclinical hyperthyroidism 01/03/2014   Past Medical History:  Diagnosis Date   Anemia    B12 deficiency 04/14/2023   Diabetes mellitus without complication (HCC)    Enlarged thyroid     High cholesterol    History of iron deficiency anemia    Hyperlipidemia 2019   Sleep deprivation 04/14/2023   Thyroid  goiter    Thyroid  nodule    Multiple   Vitamin D  deficiency    Past Surgical History:  Procedure Laterality Date   CESAREAN SECTION     DILATION AND CURETTAGE OF UTERUS     Miscarriage   Family History  Problem Relation Age of Onset   Cancer Mother    Diabetes Mother    Hypertension Mother    Heart failure Father  Hyperlipidemia Father    Heart disease Father    Cancer Maternal Uncle    Diabetes Sister    Heart disease Sister    Hyperlipidemia Sister    Diabetes Sister    Hyperlipidemia Sister    Heart disease Paternal Uncle    Outpatient Medications Prior to Visit  Medication Sig Dispense Refill   atorvastatin  (LIPITOR) 40 MG tablet TAKE 1 TABLET BY MOUTH EVERYDAY AT BEDTIME 90 tablet 1   b complex vitamins capsule Take at least 500 mcg b12 daily     metFORMIN  (GLUCOPHAGE -XR) 500 MG  24 hr tablet Take 2 tablets (1,000 mg total) by mouth daily with breakfast. 180 tablet 3   Vitamin D , Ergocalciferol , (DRISDOL ) 1.25 MG (50000 UNIT) CAPS capsule Take 1 capsule (50,000 Units total) by mouth every 7 (seven) days. 4 capsule 1   No facility-administered medications prior to visit.   No Known Allergies   ROS: A complete ROS was performed with pertinent positives/negatives noted in the HPI. The remainder of the ROS are negative.    Objective:   Today's Vitals   06/30/24 1514  BP: 130/76  Pulse: 91  SpO2: 99%  Weight: 211 lb (95.7 kg)  Height: 5' 0.5 (1.537 m)    GENERAL: Well-appearing, in NAD. Well nourished.  SKIN: Pink, warm and dry. No rash, lesion, ulceration, or ecchymoses.  Head: Normocephalic. NECK: Trachea midline.  THROAT: Mucous membranes pink and moist.  RESPIRATORY: Chest wall symmetrical. Respirations even and non-labored. Breath sounds clear to auscultation bilaterally.  CARDIAC: S1, S2 present, regular rate and rhythm without murmur or gallops. Peripheral pulses 2+ bilaterally.  MSK: Muscle tone and strength appropriate for age. Joints w/o tenderness, redness, or swelling.  EXTREMITIES: Without clubbing, cyanosis, or edema.  NEUROLOGIC: No motor or sensory deficits. Steady, even gait. C2-C12 intact.  PSYCH/MENTAL STATUS: Alert, oriented x 3. Cooperative, appropriate mood and affect.    Diabetic Foot Exam - Simple   Simple Foot Form Diabetic Foot exam was performed with the following findings: Yes 06/30/2024  4:45 PM  Visual Inspection No deformities, no ulcerations, no other skin breakdown bilaterally: Yes Sensation Testing Intact to touch and monofilament testing bilaterally: Yes Pulse Check Posterior Tibialis and Dorsalis pulse intact bilaterally: Yes Comments      Health Maintenance Due  Topic Date Due   OPHTHALMOLOGY EXAM  Never done   Pneumococcal Vaccine: 50+ Years (1 of 2 - PCV) Never done   Hepatitis B Vaccines 19-59 Average  Risk (1 of 3 - 19+ 3-dose series) Never done   Fecal DNA (Cologuard)  Never done   Zoster Vaccines- Shingrix (1 of 2) Never done   COVID-19 Vaccine (3 - 2025-26 season) 04/18/2024   HEMOGLOBIN A1C  04/30/2024   Cervical Cancer Screening (HPV/Pap Cotest)  07/04/2024   Diabetic kidney evaluation - Urine ACR  07/12/2024   Mammogram  08/03/2024    No results found for any visits on 06/30/24.  The 10-year ASCVD risk score (Arnett DK, et al., 2019) is: 4.5%     Assessment & Plan:  1. Type 2 diabetes mellitus without complication, without long-term current use of insulin  (HCC) (Primary) Controlled. Pt advised to continue metformin , healthy diet and lifestyle. BMP, A1c, and urine microalbumin ordered today. Encouraged pt to see her eye doctor for annual diabetic eye exam. Pt verbalized understanding and states she will make an appt. Diabetic foot exam completed today as well.  - Basic metabolic panel with GFR - Hemoglobin A1c - Microalbumin / creatinine urine  ratio  2. Hyperlipidemia associated with type 2 diabetes mellitus (HCC) Pt is currently taking a statin, but is unsure of which one she is taking (atorvastatin  vs pravastatin). Pt advised to check medication bottle at home and to let provider know which one she is taking. Will collect lipid panel today as well.  - Lipid panel  3. Brain fog Pt reports eating three meals per day, consumes adequate amount of water (3-4 thermoses of water per day) and states she sleeps well at night. Explained to pt that metformin  can cause vitamin malabsorption, therefore we will order lab work to check vitamin B12 and vitamin D  levels today.  - Vitamin B12 - VITAMIN D  25 Hydroxy (Vit-D Deficiency, Fractures)  4. Encounter for screening mammogram for malignant neoplasm of breast Order placed per pt request.  - MM 3D SCREENING MAMMOGRAM BILATERAL BREAST; Future  5. Screening for colon cancer Pt states she has cologuard kit at home but has not completed  it. Discussed recommendation for colon cancer screening. Pt verbalized understanding, denied having any questions and states she will work towards completing the kit.   6. Sciatica of right side Uncontrolled. Pt requesting steroid injection today; Kenalog  40mg  IM ordered. Discussed physical therapy as an option as well; pt is interested and requested referral. PCP offered xray, patient declined.  - triamcinolone  acetonide (KENALOG -40) injection 40 mg - Ambulatory referral to Physical Therapy   Meds ordered this encounter  Medications   triamcinolone  acetonide (KENALOG -40) injection 40 mg   Lab Orders         Basic metabolic panel with GFR         Hemoglobin A1c         Lipid panel         Vitamin B12         VITAMIN D  25 Hydroxy (Vit-D Deficiency, Fractures)         Microalbumin / creatinine urine ratio     No images are attached to the encounter or orders placed in the encounter.  Return in about 6 months (around 12/28/2024) for Annual Physical, Diabetes, Hyperlipidemia.   Patient to reach out to office if new, worrisome, or unresolved symptoms arise or if no improvement in patient's condition. Patient verbalized understanding and is agreeable to treatment plan. All questions answered to patient's satisfaction.   Thersia Stark, FNP-C

## 2024-06-30 NOTE — Progress Notes (Signed)
 Subjective:   Sherrol A Kraeger July 24, 1971 06/30/2024  Chief Complaint  Patient presents with   Medical Management of Chronic Issues    Pt is here today for a follow up. States has been taking the metformin  that was prescribed at last OV and states at times she will feel foggyheaded and also states she is still having pain in her right leg. Also is asking for labwork to be done.    HPI: Lenette A Capraro presents today for re-assessment and management of chronic medical conditions.  DIABETES MELLITUS: Dayane A Norsworthy presents for the medical management of diabetes.  Current diabetes medication regimen: Metformin  1000mg  daily Patient is adhering to a diabetic diet.  Patient is not exercising regularly.  Patient is not checking BS regularly.  Patient is checking their feet regularly.  Denies polydipsia, polyphagia, polyuria, open wounds or ulcers on feet.  Pt states she feels brain fog sometimes after taking Metformin .   Lab Results  Component Value Date   HGBA1C 6.1 (H) 10/29/2023    Foot Exam: 06/30/2024 Lab Results  Component Value Date   MICROALBUR 10 07/13/2023    Wt Readings from Last 3 Encounters:  06/30/24 95.7 kg  10/29/23 97.5 kg  09/02/23 95.7 kg    HYPERLIPIDEMIA: Ameia A Mccullar presents for the medical management of hyperlipidemia.  Patient's current HLD regimen is: Pt states she is taking pravastatin, however is currently prescribed atorvastatin . She is unsure of which one she is taking but is taking a statin.  Patient is currently taking prescribed medications for HLD.  Adhering to heathy diet: Yes Exercising regularly: No  RIGHT LEG PAIN:  Pt states her RLE pain has returned since previous visit during March 2025. She denies trauma or injury. Walking and sitting worsens pain. She has not taken any OTC meds for sx, has not tried ice/heat, and has not tried any stretches. She denies back pain.    The following portions of the  patient's history were reviewed and updated as appropriate: past medical history, past surgical history, family history, social history, allergies, medications, and problem list.   Patient Active Problem List   Diagnosis Date Noted   Sciatica of right side 10/29/2023   Hyperlipidemia associated with type 2 diabetes mellitus (HCC) 07/13/2023   Type 2 diabetes mellitus without complication, without long-term current use of insulin  (HCC) 07/13/2023   Abnormal craving 06/16/2023   BMI 40.0-44.9, adult (HCC) Current BMI 40.6 04/14/2023   Vitamin D  deficiency 03/03/2023   Morbid obesity (HCC) 01/19/2023   Multinodular goiter 01/19/2023   Enlarged thyroid  gland 08/05/2022   Multiple thyroid  nodules 08/05/2022   Toxic multinodular goiter w/o crisis 06/30/2014   Subclinical hyperthyroidism 01/03/2014   Past Medical History:  Diagnosis Date   Anemia    B12 deficiency 04/14/2023   Diabetes mellitus without complication (HCC)    Enlarged thyroid     High cholesterol    History of iron deficiency anemia    Hyperlipidemia 2019   Sleep deprivation 04/14/2023   Thyroid  goiter    Thyroid  nodule    Multiple   Vitamin D  deficiency    Past Surgical History:  Procedure Laterality Date   CESAREAN SECTION     DILATION AND CURETTAGE OF UTERUS     Miscarriage   Family History  Problem Relation Age of Onset   Cancer Mother    Diabetes Mother    Hypertension Mother    Heart failure Father    Hyperlipidemia Father    Heart  disease Father    Cancer Maternal Uncle    Diabetes Sister    Heart disease Sister    Hyperlipidemia Sister    Diabetes Sister    Hyperlipidemia Sister    Heart disease Paternal Uncle    Outpatient Medications Prior to Visit  Medication Sig Dispense Refill   atorvastatin  (LIPITOR) 40 MG tablet TAKE 1 TABLET BY MOUTH EVERYDAY AT BEDTIME 90 tablet 1   b complex vitamins capsule Take at least 500 mcg b12 daily     metFORMIN  (GLUCOPHAGE -XR) 500 MG 24 hr tablet Take 2  tablets (1,000 mg total) by mouth daily with breakfast. 180 tablet 3   Vitamin D , Ergocalciferol , (DRISDOL ) 1.25 MG (50000 UNIT) CAPS capsule Take 1 capsule (50,000 Units total) by mouth every 7 (seven) days. 4 capsule 1   No facility-administered medications prior to visit.   No Known Allergies   ROS: A complete ROS was performed with pertinent positives/negatives noted in the HPI. The remainder of the ROS are negative.    Objective:   Today's Vitals   06/30/24 1514  BP: 130/76  Pulse: 91  SpO2: 99%  Weight: 95.7 kg  Height: 5' 0.5 (1.537 m)    GENERAL: Well-appearing, in NAD. Well nourished.  SKIN: Pink, warm and dry. No rash, lesion, ulceration, or ecchymoses.  Head: Normocephalic. NECK: Trachea midline.  THROAT: Mucous membranes pink and moist.  RESPIRATORY: Chest wall symmetrical. Respirations even and non-labored. Breath sounds clear to auscultation bilaterally.  CARDIAC: S1, S2 present, regular rate and rhythm without murmur or gallops. Peripheral pulses 2+ bilaterally.  MSK: Muscle tone and strength appropriate for age. Joints w/o tenderness, redness, or swelling.  EXTREMITIES: Without clubbing, cyanosis, or edema.  NEUROLOGIC: No motor or sensory deficits. Steady, even gait. C2-C12 intact.  PSYCH/MENTAL STATUS: Alert, oriented x 3. Cooperative, appropriate mood and affect.    Diabetic Foot Exam - Simple   Simple Foot Form Diabetic Foot exam was performed with the following findings: Yes 06/30/2024  4:45 PM  Visual Inspection No deformities, no ulcerations, no other skin breakdown bilaterally: Yes Sensation Testing Intact to touch and monofilament testing bilaterally: Yes Pulse Check Posterior Tibialis and Dorsalis pulse intact bilaterally: Yes Comments      Health Maintenance Due  Topic Date Due   OPHTHALMOLOGY EXAM  Never done   Pneumococcal Vaccine: 50+ Years (1 of 2 - PCV) Never done   Hepatitis B Vaccines 19-59 Average Risk (1 of 3 - 19+ 3-dose  series) Never done   Fecal DNA (Cologuard)  Never done   Zoster Vaccines- Shingrix (1 of 2) Never done   COVID-19 Vaccine (3 - 2025-26 season) 04/18/2024   HEMOGLOBIN A1C  04/30/2024   Cervical Cancer Screening (HPV/Pap Cotest)  07/04/2024   Diabetic kidney evaluation - Urine ACR  07/12/2024   Mammogram  08/03/2024    No results found for any visits on 06/30/24.  The 10-year ASCVD risk score (Arnett DK, et al., 2019) is: 4.5%     Assessment & Plan:  1. Type 2 diabetes mellitus without complication, without long-term current use of insulin  (HCC) (Primary) Controlled. Pt advised to continue metformin , healthy diet and lifestyle. BMP, A1c, and urine microalbumin ordered today. Encouraged pt to see her eye doctor for annual diabetic eye exam. Pt verbalized understanding and states she will make an appt. Diabetic foot exam completed today as well.  - Basic metabolic panel with GFR - Hemoglobin A1c - Microalbumin / creatinine urine ratio  2. Hyperlipidemia associated with type 2  diabetes mellitus (HCC) Pt is currently taking a statin, but is unsure of which one she is taking (atorvastatin  vs pravastatin). Pt advised to check medication bottle at home and to let provider know which one she is taking. Will collect lipid panel today as well.  - Lipid panel  3. Brain fog Pt reports eating three meals per day, consumes adequate amount of water (3-4 thermoses of water per day) and states she sleeps well at night. Explained to pt that metformin  can cause vitamin malabsorption, therefore we will order lab work to check vitamin B12 and vitamin D  levels today.  - Vitamin B12 - VITAMIN D  25 Hydroxy (Vit-D Deficiency, Fractures)  4. Encounter for screening mammogram for malignant neoplasm of breast Order placed per pt request.  - MM 3D SCREENING MAMMOGRAM BILATERAL BREAST; Future  5. Screening for colon cancer Pt states she has cologuard kit at home but has not completed it. Discussed  recommendation for colon cancer screening. Pt verbalized understanding, denied having any questions and states she will work towards completing the kit.   6. Sciatica of right side Uncontrolled. Pt requesting steroid injection today; Kenalog  40mg  IM ordered. Discussed physical therapy as an option as well; pt is interested and requested referral. - triamcinolone  acetonide (KENALOG -40) injection 40 mg - Ambulatory referral to Physical Therapy   Meds ordered this encounter  Medications   triamcinolone  acetonide (KENALOG -40) injection 40 mg   Lab Orders         Basic metabolic panel with GFR         Hemoglobin A1c         Lipid panel         Vitamin B12         VITAMIN D  25 Hydroxy (Vit-D Deficiency, Fractures)         Microalbumin / creatinine urine ratio     No images are attached to the encounter or orders placed in the encounter.  Return in about 6 months (around 12/28/2024) for Annual Physical, Diabetes, Hyperlipidemia.    Patient to reach out to office if new, worrisome, or unresolved symptoms arise or if no improvement in patient's condition. Patient verbalized understanding and is agreeable to treatment plan. All questions answered to patient's satisfaction.   Treatment plan and recommendation(s) reviewed by supervising preceptor, Thersia CLEMENTEEN Stark, FNP-C, prior to clinic discharge.   Rosina Ada, BSN, RN  DNP Student

## 2024-06-30 NOTE — Patient Instructions (Signed)
 Check medication bottles when you get home to see if you are currently taking pravastatin or atorvastatin  for your cholesterol. Call or message provider through MyChart system regarding which one you are taking so that we can update your portal and send refills as needed.

## 2024-07-01 LAB — BASIC METABOLIC PANEL WITH GFR
BUN/Creatinine Ratio: 26 — ABNORMAL HIGH (ref 9–23)
BUN: 16 mg/dL (ref 6–24)
CO2: 24 mmol/L (ref 20–29)
Calcium: 9.6 mg/dL (ref 8.7–10.2)
Chloride: 102 mmol/L (ref 96–106)
Creatinine, Ser: 0.61 mg/dL (ref 0.57–1.00)
Glucose: 96 mg/dL (ref 70–99)
Potassium: 4.6 mmol/L (ref 3.5–5.2)
Sodium: 139 mmol/L (ref 134–144)
eGFR: 106 mL/min/1.73 (ref >59)

## 2024-07-01 LAB — HEMOGLOBIN A1C
Est. average glucose Bld gHb Est-mCnc: 131 mg/dL
Hgb A1c MFr Bld: 6.2 % — ABNORMAL HIGH (ref 4.8–5.6)

## 2024-07-01 LAB — VITAMIN B12: Vitamin B-12: 375 pg/mL (ref 232–1245)

## 2024-07-01 LAB — LIPID PANEL
Chol/HDL Ratio: 3.7 ratio (ref 0.0–4.4)
Cholesterol, Total: 273 mg/dL — ABNORMAL HIGH (ref 100–199)
HDL: 74 mg/dL (ref >39)
LDL Chol Calc (NIH): 183 mg/dL — ABNORMAL HIGH (ref 0–99)
Triglycerides: 93 mg/dL (ref 0–149)
VLDL Cholesterol Cal: 16 mg/dL (ref 5–40)

## 2024-07-01 LAB — MICROALBUMIN / CREATININE URINE RATIO
Creatinine, Urine: 211.2 mg/dL
Microalb/Creat Ratio: 12 mg/g{creat} (ref 0–29)
Microalbumin, Urine: 26.1 ug/mL

## 2024-07-01 LAB — VITAMIN D 25 HYDROXY (VIT D DEFICIENCY, FRACTURES): Vit D, 25-Hydroxy: 41 ng/mL (ref 30.0–100.0)

## 2024-07-02 ENCOUNTER — Encounter (HOSPITAL_BASED_OUTPATIENT_CLINIC_OR_DEPARTMENT_OTHER): Payer: Self-pay | Admitting: Family Medicine

## 2024-07-04 NOTE — Telephone Encounter (Signed)
 Please see mychart message sent by pt and advise.

## 2024-07-05 ENCOUNTER — Ambulatory Visit (HOSPITAL_BASED_OUTPATIENT_CLINIC_OR_DEPARTMENT_OTHER): Payer: Self-pay | Admitting: Family Medicine

## 2024-07-05 ENCOUNTER — Other Ambulatory Visit (HOSPITAL_BASED_OUTPATIENT_CLINIC_OR_DEPARTMENT_OTHER): Payer: Self-pay | Admitting: Family Medicine

## 2024-07-05 DIAGNOSIS — E1169 Type 2 diabetes mellitus with other specified complication: Secondary | ICD-10-CM

## 2024-07-05 MED ORDER — ATORVASTATIN CALCIUM 80 MG PO TABS: 80.0000 mg | ORAL_TABLET | Freq: Every evening | ORAL | 2 refills | Status: AC

## 2024-07-05 NOTE — Progress Notes (Signed)
 Please call patient and let her know that her cholesterol is still elevated and I would like to recommend increasing her atorvastatin  to 80 mg.  If she is agreeable please let me know and I will send in the new dosage.    A1c is well-controlled.  Her B12 and vitamin D  are stable.

## 2024-08-24 ENCOUNTER — Encounter: Payer: Self-pay | Admitting: Internal Medicine

## 2024-08-24 ENCOUNTER — Other Ambulatory Visit

## 2024-08-24 ENCOUNTER — Ambulatory Visit (INDEPENDENT_AMBULATORY_CARE_PROVIDER_SITE_OTHER): Payer: 59 | Admitting: Internal Medicine

## 2024-08-24 VITALS — BP 128/80 | HR 102 | Ht 60.5 in | Wt 210.0 lb

## 2024-08-24 DIAGNOSIS — E042 Nontoxic multinodular goiter: Secondary | ICD-10-CM | POA: Diagnosis not present

## 2024-08-24 NOTE — Progress Notes (Signed)
 "   Name: Sarah Travis  MRN/ DOB: [id number], 26-Jul-1971    Age/ Sex: 54 y.o., female    PCP: Knute Thersia Bitters, FNP   Reason for Endocrinology Evaluation: MNG     Date of Initial Endocrinology Evaluation: 02/05/2022    HPI: [name] is a 54 y.o. female with a past medical history of MNG. The patient presented for initial endocrinology clinic visit on 02/05/2022 for consultative assistance with her MNG.   She was diagnosed with multinodular goiter in 2012 based on thyroid  ultrasound.  She also had thyroid  uptake and scan which was normal at 12% uptake but consistent with multinodular goiter, and a left dominant cold nodule. Left  Cold nodule was biopsied in 2015 with benign cytology She is also S/P benign FNA of the left inferior nodule in 2015   She is S/P benign FNA of the left mid and right mid nodule 02/2022  Paternal aunt with thyroid  disease     SUBJECTIVE:   Today (08/24/2024): Ms . Sarah Travis is here for a follow up on MNG    She met with Dr. Eletha 07/2022, surgery was scheduled for 09/05/2022 but it was canceled, due to insurance issues and a high co-pay of $22,000  The patient was unable to proceed with thyroid  nodule FNA in April, 2025 due to loss of insurance  Has not noted increase in local neck swelling  No dysphagia  No palpitations  No  diarrhea has rare constipation    HISTORY:  Past Medical History:  Past Medical History:  Diagnosis Date   Anemia    B12 deficiency 04/14/2023   Diabetes mellitus without complication (HCC)    Enlarged thyroid     High cholesterol    History of iron deficiency anemia    Hyperlipidemia 2019   Sleep deprivation 04/14/2023   Thyroid  goiter    Thyroid  nodule    Multiple   Vitamin D  deficiency    Past Surgical History:  Past Surgical History:  Procedure Laterality Date   CESAREAN SECTION     DILATION AND CURETTAGE OF UTERUS     Miscarriage    Social History:  reports that she  has never smoked. She has never used smokeless tobacco. She reports that she does not drink alcohol and does not use drugs. Family History: family history includes Cancer in her maternal uncle and mother; Diabetes in her mother, sister, and sister; Heart disease in her father, paternal uncle, and sister; Heart failure in her father; Hyperlipidemia in her father, sister, and sister; Hypertension in her mother.   HOME MEDICATIONS: Allergies as of 08/24/2024   No Known Allergies      Medication List        Accurate as of August 24, 2024  2:35 PM. If you have any questions, ask your nurse or doctor.          atorvastatin  80 MG tablet Commonly known as: LIPITOR Take 1 tablet (80 mg total) by mouth at bedtime.   b complex vitamins capsule Take at least 500 mcg b12 daily   metFORMIN  500 MG 24 hr tablet Commonly known as: GLUCOPHAGE -XR Take 2 tablets (1,000 mg total) by mouth daily with breakfast.   Vitamin D  (Ergocalciferol ) 1.25 MG (50000 UNIT) Caps capsule Commonly known as: DRISDOL  Take 1 capsule (50,000 Units total) by mouth every 7 (seven) days.          REVIEW OF SYSTEMS: A comprehensive ROS was conducted with the patient and is  negative except as per HPI    OBJECTIVE:  VS: BP 128/80   Pulse (!) 102   Ht 5' 0.5 (1.537 m)   Wt 210 lb (95.3 kg)   SpO2 96%   BMI 40.34 kg/m    Wt Readings from Last 3 Encounters:  08/24/24 210 lb (95.3 kg)  06/30/24 211 lb (95.7 kg)  10/29/23 215 lb (97.5 kg)     EXAM: General: Pt appears well and is in NAD  Neck: General: Supple without adenopathy. Thyroid : Bilateral thyroid  nodules appreciated  Lungs: Clear with good BS bilat   Heart: Auscultation: RRR.  Abdomen: soft, nontender  Extremities:  BL LE: No pretibial edema   Mental Status: Judgment, insight: Intact Orientation: Oriented to time, place, and person Mood and affect: No depression, anxiety, or agitation     DATA REVIEWED:   Latest Reference Range &  Units 08/24/24 14:45  TSH mIU/L 0.27 (L)  T4,Free(Direct) 0.8 - 1.8 ng/dL 1.1  (L): Data is abnormally low   Latest Reference Range & Units 06/30/24 16:58  Sodium 134 - 144 mmol/L 139  Potassium 3.5 - 5.2 mmol/L 4.6  Chloride 96 - 106 mmol/L 102  CO2 20 - 29 mmol/L 24  Glucose 70 - 99 mg/dL 96  BUN 6 - 24 mg/dL 16  Creatinine 9.42 - 8.99 mg/dL 9.38  Calcium  8.7 - 10.2 mg/dL 9.6  BUN/Creatinine Ratio 9 - 23  26 (H)  eGFR >59 mL/min/1.73 106  Total CHOL/HDL Ratio 0.0 - 4.4 ratio 3.7  Cholesterol, Total 100 - 199 mg/dL 726 (H)  HDL Cholesterol >39 mg/dL 74  MICROALB/CREAT RATIO 0 - 29 mg/g creat 12  Triglycerides 0 - 149 mg/dL 93  VLDL Cholesterol Cal 5 - 40 mg/dL 16  LDL Chol Calc (NIH) 0 - 99 mg/dL 816 (H)  (H): Data is abnormally high   Thyroid  Ultrasound 09/16/2023  FINDINGS: Parenchymal Echotexture: Moderately heterogeneous   Isthmus: 0.7 cm ,previously 0.6 cm   Right lobe: 7.0 x 2.5 x 2.6 cm ,previously 7.8 x 3.2 x 2.4 cm   Left lobe: 8.7 x 3.5 x 5.0 cm ,previously 9.3 x 3.9 x 4.3 cm   ________________________________________________________   Estimated total number of nodules >/= 1 cm: 6-10   Number of spongiform nodules >/=  2 cm not described below (TR1): 0   Number of mixed cystic and solid nodules >/= 1.5 cm not described below (TR2): 0   _________________________________________________________   Similar appearance of previously biopsied right superior solid thyroid  nodule (labeled 1, 3.5 cm, previously 3.2 cm).   Nodule # 2:   Prior biopsy: No   Location: Right; Mid   Maximum size: 1.8 cm; Other 2 dimensions: 1.3 x 1.6 cm, previously, 1.4 x 1.0 x 1.2 cm   Composition: solid/almost completely solid (2)   Echogenicity: isoechoic (1)   Shape: not taller-than-wide (0)   Margins: ill-defined (0)   Echogenic foci: none (0)   ACR TI-RADS total points: 3.   ACR TI-RADS risk category:  TR3 (3 points).   Significant change in size (>/= 20% in  two dimensions and minimal increase of 2 mm): Yes   Change in features: No   Change in ACR TI-RADS risk category: No   ACR TI-RADS recommendations:   *Given size (>/= 1.5 - 2.4 cm) and appearance, a follow-up ultrasound in 1 year should be considered based on TI-RADS criteria.   _________________________________________________________   Nodule # 3:   Prior biopsy: No   Location: Right; Inferior  Maximum size: 2.9 cm; Other 2 dimensions: 1.5 x 1.7 cm, previously, 2.9 x 2.5 x 2.6 cm   Composition: solid/almost completely solid (2)   Echogenicity: hypoechoic (2)   Shape: not taller-than-wide (0)   Margins: ill-defined (0)   Echogenic foci: none (0)   ACR TI-RADS total points: 4.   ACR TI-RADS risk category:  TR4 (4-6 points).   Significant change in size (>/= 20% in two dimensions and minimal increase of 2 mm): No   Change in features: Yes   Change in ACR TI-RADS risk category: Yes   ACR TI-RADS recommendations:   **Given size (>/= 1.5 cm) and appearance, fine needle aspiration of this moderately suspicious nodule should be considered based on TI-RADS criteria.   _________________________________________________________   Previously biopsied left superior solid thyroid  nodule (labeled 4, 2.5 cm, previously 2.4 cm) appear similar.   Similar appearance of previously biopsied left inferior solid thyroid  nodule (labeled 5, 4.3 cm, previously 4.1 cm).   no cervical lymphadenopathy.   IMPRESSION: 1. Similar appearing multinodular goiter. 2. Solid nodule in the right inferior thyroid  (labeled 3, 2.9 cm, previously 2.9 cm) meets criteria (TI-RADS category 4) for tissue sampling. Recommend ultrasound-guided fine-needle aspiration. 3. Slight interval enlargement of previously visualized right mid solid thyroid  nodule (labeled 2, 1.8 cm, previously 1.2 cm) which now meets criteria (TI-RADS category 3) for 1 year ultrasound surveillance. 4. Similar appearance  of previously biopsied right superior (labeled 1, 3.5 cm, previously 3.2 cm) left superior (labeled 4, 2.5 cm, previously 2.4 cm) and left inferior (labeled 5, 4.3 cm, previously 4.1 cm). Recommend correlation with prior biopsy results.     02/16/2014 Uptake and Scan: 24 hour radioiodine uptake calculated at 12%, normal.  Images of the thyroid  gland demonstrate a multinodular appearance of both lobes with multiple warm and cold nodules. No dominant hot nodules.  Dominant cold nodule at mid to upper pole of LEFT lobe corresponding to the largest nodule on ultrasound, though this nodule is stable in size from an earlier ultrasound from 2012.  IMPRESSION: Multinodular thyroid  gland as above.    FNA left mid nodule 03/08/2014  Diagnosis THYROID , FINE NEEDLE ASPIRATION, LEFT MID (SPECIMEN 2 OF 2, COLLECTED ON 03/08/14): BENIGN. FINDINGS CONSISTENT WITH NON-NEOPLASTIC GOITER.    FNA Left inferior 03/08/2014 THYROID , FINE NEEDLE ASPIRATION, LEFT INFERIOR (SPECIMEN 1 OF 2, COLLECTED ON 03/08/14): BENIGN. FINDINGS CONSISTENT WITH NON-NEOPLASTIC GOITER.    FNA Left mid nodule 1.4 cm 02/26/2022  Clinical History: None provided  Specimen Submitted:  B. THYROID , LEFT MID, FINE NEEDLE ASPIRATION:    FINAL MICROSCOPIC DIAGNOSIS:  - Consistent with benign follicular nodule (Bethesda category II)   FNA Right Mid nodule 3.2 cm 02/26/2022   Clinical History: None provided Specimen Submitted:  A. THYROID , RIGHT MID, FINE NEEDLE ASPIRATION:   FINAL MICROSCOPIC DIAGNOSIS: - Consistent with benign follicular nodule (Bethesda category II)     ASSESSMENT/PLAN/RECOMMENDATIONS:   MNG:  -No local neck symptoms -Patient is clinically euthyroid -In the past she has expressed interest in surgical intervention, but insurance has been a barrier -Today I explained to the patient that we could monitor thyroid  nodules with serial ultrasound -I did also explain to the patient the option for  radiofrequency ablation should she become symptomatic - She is S/P benign FNA of the left mid and left inferior nodules in 2015 - She is S/P benign FNA of the left mid and right mid nodule 02/2022 - She was unable to proceed with FNA of the right inferior  nodule in 2025 due to insurance issues, will proceed with repeat thyroid  ultrasound   2. T2DM:  - This is managed by her PCP, she is on metformin , but she did have questions and we discussed the importance of lifestyle changes, we also discussed cardiac benefits of metformin  - Will defer further management to PCP   3. Subclinical Hyperthyroidism :  - Patient is clinically euthyroid - No intervention at this time, we will continue to monitor and intervene when necessary   Follow-up in 6 months    I spent 25 minutes preparing to see the patient by review of recent labs, imaging and procedures, obtaining and reviewing separately obtained history, communicating with the patient, ordering medications, tests or procedures, and documenting clinical information in the EHR including the differential Dx, treatment, and any further evaluation and other management    Signed electronically by: Stefano Redgie Butts, MD  [facility] Endocrinology  [facility] [address] [place], [place] [address] Phone: [phone] FAX: [phone]   CC: Knute Thersia Bitters, FNP [address] [place] [address] Phone: [phone] Fax: [phone]   Return to Endocrinology clinic as below: Future Appointments  Date Time Provider Department Center  08/30/2024  8:15 AM Delores Nidia CROME, FNP [facility] [id number]  10/03/2024  8:40 AM [facility] [id number]         "

## 2024-08-25 ENCOUNTER — Telehealth: Payer: Self-pay | Admitting: Internal Medicine

## 2024-08-25 ENCOUNTER — Ambulatory Visit: Payer: Self-pay | Admitting: Internal Medicine

## 2024-08-25 LAB — TSH: TSH: 0.27 m[IU]/L — ABNORMAL LOW

## 2024-08-25 LAB — T4, FREE: Free T4: 1.1 ng/dL (ref 0.8–1.8)

## 2024-08-25 NOTE — Telephone Encounter (Signed)
 Can you please schedule this patient to see me in 6 months rather than 1 year.   Her labs were abnormal and I need to see her sooner   Thank you

## 2024-08-30 ENCOUNTER — Encounter (HOSPITAL_BASED_OUTPATIENT_CLINIC_OR_DEPARTMENT_OTHER): Admitting: Obstetrics and Gynecology

## 2024-09-06 ENCOUNTER — Ambulatory Visit
Admission: RE | Admit: 2024-09-06 | Discharge: 2024-09-06 | Disposition: A | Source: Ambulatory Visit | Attending: Internal Medicine | Admitting: Internal Medicine

## 2024-09-06 DIAGNOSIS — E042 Nontoxic multinodular goiter: Secondary | ICD-10-CM

## 2024-09-09 ENCOUNTER — Other Ambulatory Visit (HOSPITAL_BASED_OUTPATIENT_CLINIC_OR_DEPARTMENT_OTHER): Payer: Self-pay | Admitting: Family Medicine

## 2024-09-09 DIAGNOSIS — E119 Type 2 diabetes mellitus without complications: Secondary | ICD-10-CM

## 2024-10-03 ENCOUNTER — Other Ambulatory Visit (HOSPITAL_BASED_OUTPATIENT_CLINIC_OR_DEPARTMENT_OTHER)

## 2024-10-25 ENCOUNTER — Ambulatory Visit (HOSPITAL_BASED_OUTPATIENT_CLINIC_OR_DEPARTMENT_OTHER): Admitting: Physical Therapy

## 2025-02-21 ENCOUNTER — Ambulatory Visit: Admitting: Internal Medicine

## 2025-08-23 ENCOUNTER — Ambulatory Visit: Admitting: Internal Medicine
